# Patient Record
Sex: Male | Born: 1945 | Race: Black or African American | Hispanic: No | Marital: Single | Smoking: Never smoker
Health system: Southern US, Community
[De-identification: ages and names within clinical notes are randomized; demographics above are authoritative.]

## PROBLEM LIST (undated history)

## (undated) HISTORY — PX: TESTICLE REMOVAL: SHX68

---

## 2021-02-17 ENCOUNTER — Inpatient Hospital Stay (HOSPITAL_COMMUNITY)
Admission: EM | Admit: 2021-02-17 | Discharge: 2021-02-23 | DRG: 698 | Disposition: A | Discharge status: Skilled Nursing Facility | Payer: Medicare HMO | Source: Skilled Nursing Facility | Attending: Internal Medicine | Admitting: Internal Medicine

## 2021-02-17 ENCOUNTER — Emergency Department (HOSPITAL_COMMUNITY): Payer: Medicare HMO

## 2021-02-17 ENCOUNTER — Encounter (HOSPITAL_COMMUNITY): Payer: Self-pay | Admitting: Emergency Medicine

## 2021-02-17 DIAGNOSIS — D649 Anemia, unspecified: Secondary | ICD-10-CM

## 2021-02-17 DIAGNOSIS — N401 Enlarged prostate with lower urinary tract symptoms: Secondary | ICD-10-CM | POA: Diagnosis present

## 2021-02-17 DIAGNOSIS — D62 Acute posthemorrhagic anemia: Secondary | ICD-10-CM | POA: Diagnosis present

## 2021-02-17 DIAGNOSIS — Z20822 Contact with and (suspected) exposure to covid-19: Secondary | ICD-10-CM | POA: Diagnosis present

## 2021-02-17 DIAGNOSIS — N492 Inflammatory disorders of scrotum: Secondary | ICD-10-CM | POA: Diagnosis present

## 2021-02-17 DIAGNOSIS — Z7901 Long term (current) use of anticoagulants: Secondary | ICD-10-CM | POA: Diagnosis present

## 2021-02-17 DIAGNOSIS — B952 Enterococcus as the cause of diseases classified elsewhere: Secondary | ICD-10-CM | POA: Diagnosis present

## 2021-02-17 DIAGNOSIS — E669 Obesity, unspecified: Secondary | ICD-10-CM | POA: Diagnosis present

## 2021-02-17 DIAGNOSIS — N390 Urinary tract infection, site not specified: Secondary | ICD-10-CM | POA: Diagnosis not present

## 2021-02-17 DIAGNOSIS — Z1612 Extended spectrum beta lactamase (ESBL) resistance: Secondary | ICD-10-CM | POA: Diagnosis present

## 2021-02-17 DIAGNOSIS — Y846 Urinary catheterization as the cause of abnormal reaction of the patient, or of later complication, without mention of misadventure at the time of the procedure: Secondary | ICD-10-CM | POA: Diagnosis present

## 2021-02-17 DIAGNOSIS — N138 Other obstructive and reflux uropathy: Secondary | ICD-10-CM | POA: Diagnosis present

## 2021-02-17 DIAGNOSIS — Z8619 Personal history of other infectious and parasitic diseases: Secondary | ICD-10-CM

## 2021-02-17 DIAGNOSIS — Z1621 Resistance to vancomycin: Secondary | ICD-10-CM | POA: Diagnosis present

## 2021-02-17 DIAGNOSIS — R310 Gross hematuria: Secondary | ICD-10-CM | POA: Diagnosis present

## 2021-02-17 DIAGNOSIS — I10 Essential (primary) hypertension: Secondary | ICD-10-CM | POA: Diagnosis present

## 2021-02-17 DIAGNOSIS — Z9079 Acquired absence of other genital organ(s): Secondary | ICD-10-CM

## 2021-02-17 DIAGNOSIS — I69351 Hemiplegia and hemiparesis following cerebral infarction affecting right dominant side: Secondary | ICD-10-CM

## 2021-02-17 DIAGNOSIS — R319 Hematuria, unspecified: Secondary | ICD-10-CM

## 2021-02-17 DIAGNOSIS — Z79899 Other long term (current) drug therapy: Secondary | ICD-10-CM

## 2021-02-17 DIAGNOSIS — G9341 Metabolic encephalopathy: Secondary | ICD-10-CM | POA: Diagnosis present

## 2021-02-17 DIAGNOSIS — Z6839 Body mass index (BMI) 39.0-39.9, adult: Secondary | ICD-10-CM | POA: Diagnosis not present

## 2021-02-17 DIAGNOSIS — E119 Type 2 diabetes mellitus without complications: Secondary | ICD-10-CM | POA: Diagnosis present

## 2021-02-17 DIAGNOSIS — B9620 Unspecified Escherichia coli [E. coli] as the cause of diseases classified elsewhere: Secondary | ICD-10-CM | POA: Diagnosis present

## 2021-02-17 DIAGNOSIS — I480 Paroxysmal atrial fibrillation: Secondary | ICD-10-CM | POA: Diagnosis present

## 2021-02-17 DIAGNOSIS — N179 Acute kidney failure, unspecified: Secondary | ICD-10-CM

## 2021-02-17 DIAGNOSIS — N320 Bladder-neck obstruction: Secondary | ICD-10-CM

## 2021-02-17 DIAGNOSIS — Z79891 Long term (current) use of opiate analgesic: Secondary | ICD-10-CM

## 2021-02-17 DIAGNOSIS — R338 Other retention of urine: Secondary | ICD-10-CM | POA: Diagnosis present

## 2021-02-17 DIAGNOSIS — Z978 Presence of other specified devices: Secondary | ICD-10-CM

## 2021-02-17 DIAGNOSIS — T83518A Infection and inflammatory reaction due to other urinary catheter, initial encounter: Principal | ICD-10-CM | POA: Diagnosis present

## 2021-02-17 DIAGNOSIS — Z7984 Long term (current) use of oral hypoglycemic drugs: Secondary | ICD-10-CM

## 2021-02-17 DIAGNOSIS — Z7982 Long term (current) use of aspirin: Secondary | ICD-10-CM

## 2021-02-17 DIAGNOSIS — N136 Pyonephrosis: Secondary | ICD-10-CM | POA: Diagnosis present

## 2021-02-17 DIAGNOSIS — N1330 Unspecified hydronephrosis: Secondary | ICD-10-CM

## 2021-02-17 LAB — URINALYSIS, ROUTINE W REFLEX MICROSCOPIC

## 2021-02-17 LAB — CBC WITH DIFFERENTIAL/PLATELET
Abs Immature Granulocytes: 0.02 10*3/uL (ref 0.00–0.07)
Basophils Absolute: 0 10*3/uL (ref 0.0–0.1)
Basophils Relative: 0 %
Eosinophils Absolute: 0.1 10*3/uL (ref 0.0–0.5)
Eosinophils Relative: 1 %
HCT: 21.6 % — ABNORMAL LOW (ref 39.0–52.0)
Hemoglobin: 6.5 g/dL — CL (ref 13.0–17.0)
Immature Granulocytes: 0 %
Lymphocytes Relative: 32 %
Lymphs Abs: 1.8 10*3/uL (ref 0.7–4.0)
MCH: 26.7 pg (ref 26.0–34.0)
MCHC: 30.1 g/dL (ref 30.0–36.0)
MCV: 88.9 fL (ref 80.0–100.0)
Monocytes Absolute: 0.9 10*3/uL (ref 0.1–1.0)
Monocytes Relative: 16 %
Neutro Abs: 2.8 10*3/uL (ref 1.7–7.7)
Neutrophils Relative %: 51 %
Platelets: 341 10*3/uL (ref 150–400)
RBC: 2.43 MIL/uL — ABNORMAL LOW (ref 4.22–5.81)
RDW: 16.7 % — ABNORMAL HIGH (ref 11.5–15.5)
WBC: 5.6 10*3/uL (ref 4.0–10.5)
nRBC: 0 % (ref 0.0–0.2)

## 2021-02-17 LAB — BASIC METABOLIC PANEL
Anion gap: 10 (ref 5–15)
BUN: 35 mg/dL — ABNORMAL HIGH (ref 8–23)
CO2: 17 mmol/L — ABNORMAL LOW (ref 22–32)
Calcium: 9 mg/dL (ref 8.9–10.3)
Chloride: 108 mmol/L (ref 98–111)
Creatinine, Ser: 2.92 mg/dL — ABNORMAL HIGH (ref 0.61–1.24)
GFR, Estimated: 22 mL/min — ABNORMAL LOW (ref >60)
Glucose, Bld: 96 mg/dL (ref 70–99)
Potassium: 4.4 mmol/L (ref 3.5–5.1)
Sodium: 135 mmol/L (ref 135–145)

## 2021-02-17 LAB — PREPARE RBC (CROSSMATCH)

## 2021-02-17 LAB — URINALYSIS, MICROSCOPIC (REFLEX)
RBC / HPF: >50 RBC/hpf (ref 0–5)
Squamous Epithelial / HPF: NONE SEEN (ref 0–5)
WBC, UA: >50 WBC/hpf (ref 0–5)

## 2021-02-17 LAB — ABO/RH: ABO/RH(D): O POS

## 2021-02-17 MED ORDER — SODIUM CHLORIDE 0.9 % IV SOLN
10.0000 mL/h | Freq: Once | INTRAVENOUS | Status: AC
  Administered 2021-02-17: 10 mL/h via INTRAVENOUS

## 2021-02-17 MED ORDER — SODIUM CHLORIDE 0.9 % IV BOLUS
1000.0000 mL | Freq: Once | INTRAVENOUS | Status: AC
  Administered 2021-02-18: 1000 mL via INTRAVENOUS

## 2021-02-17 MED ORDER — SODIUM CHLORIDE 0.9 % IV SOLN: 1.0000 g | INTRAVENOUS | Status: DC

## 2021-02-17 NOTE — ED Provider Notes (Signed)
[facility] EMERGENCY DEPARTMENT Provider Note   CSN: [id number] Arrival date & time: 02/17/21  1726     History Chief Complaint  Patient presents with   Hematuria    Indwelling foley cath    Matthew Williamson is a 75 y.o. male.  75 year old male history of atrial fibrillation on Eliquis, CVA, history of urinary retention with indwelling Foley catheter; reported to the ER secondary to hematuria noted within indwelling Foley cath.  No pain associated with catheter reported. Denies abdominal pain, GU trauma, nausea or vomiting.  Denies Recent medication changes.  Patient is a poor historian given history of CVA. Per EMS documentation nursing home attempted irrigation of the Foley catheter without significant improvement and hematuria over the past 2-3 days since symptom onset.   The history is provided by the patient. No language interpreter was used.  Hematuria Pertinent negatives include no chest pain, no abdominal pain, no headaches and no shortness of breath.      History reviewed. No pertinent past medical history.  There are no problems to display for this patient.   History reviewed. No pertinent surgical history.     History reviewed. No pertinent family history.     Home Medications Prior to Admission medications   Not on File    Allergies    Patient has no known allergies.  Review of Systems   Review of Systems  Constitutional:  Negative for chills and fever.  HENT:  Negative for facial swelling and trouble swallowing.   Eyes:  Negative for photophobia and visual disturbance.  Respiratory:  Negative for cough and shortness of breath.   Cardiovascular:  Negative for chest pain and palpitations.  Gastrointestinal:  Negative for abdominal pain, nausea and vomiting.  Endocrine: Negative for polydipsia and polyuria.  Genitourinary:  Positive for hematuria. Negative for difficulty urinating.  Musculoskeletal:  Negative for gait problem and  joint swelling.  Skin:  Negative for pallor and rash.  Neurological:  Negative for syncope and headaches.  Psychiatric/Behavioral:  Negative for agitation and confusion.    Physical Exam Updated Vital Signs BP 132/68   Pulse 62   Temp 99.3 F (37.4 C) (Oral)   Resp (!) 21   Ht 5\' 11"  (1.803 m)   Wt 127 kg   SpO2 100%   BMI 39.05 kg/m   Physical Exam Vitals and nursing note reviewed.  Constitutional:      General: He is not in acute distress.    Appearance: He is well-developed.  HENT:     Head: Normocephalic and atraumatic.     Right Ear: External ear normal.     Left Ear: External ear normal.     Mouth/Throat:     Mouth: Mucous membranes are moist.  Eyes:     General: No scleral icterus. Cardiovascular:     Rate and Rhythm: Normal rate. Rhythm irregular.     Pulses: Normal pulses.     Heart sounds: Normal heart sounds.  Pulmonary:     Effort: Pulmonary effort is normal. No respiratory distress.     Breath sounds: Normal breath sounds.  Abdominal:     General: Abdomen is flat.     Palpations: Abdomen is soft.     Tenderness: There is no abdominal tenderness.  Genitourinary:    Comments: 72 French Foley catheter in place, hematuria noted in Foley bag.  No bleeding around urethral meatus.  No penile or scrotal discomfort. Musculoskeletal:        General: Normal range  of motion.     Cervical back: Normal range of motion.     Right lower leg: No edema.     Left lower leg: No edema.  Skin:    General: Skin is warm and dry.     Capillary Refill: Capillary refill takes less than 2 seconds.  Neurological:     Mental Status: He is alert and oriented to person, place, and time.  Psychiatric:        Mood and Affect: Mood normal.        Behavior: Behavior normal.    ED Results / Procedures / Treatments   Labs (all labs ordered are listed, but only abnormal results are displayed) Labs Reviewed  CBC WITH DIFFERENTIAL/PLATELET - Abnormal; Notable for the following  components:      Result Value   RBC 2.43 (*)    Hemoglobin 6.5 (*)    HCT 21.6 (*)    RDW 16.7 (*)    All other components within normal limits  BASIC METABOLIC PANEL - Abnormal; Notable for the following components:   CO2 17 (*)    BUN 35 (*)    Creatinine, Ser 2.92 (*)    GFR, Estimated 22 (*)    All other components within normal limits  URINALYSIS, ROUTINE W REFLEX MICROSCOPIC - Abnormal; Notable for the following components:   Color, Urine RED (*)    APPearance TURBID (*)    Glucose, UA   (*)    Value: TEST NOT REPORTED DUE TO COLOR INTERFERENCE OF URINE PIGMENT   Hgb urine dipstick   (*)    Value: TEST NOT REPORTED DUE TO COLOR INTERFERENCE OF URINE PIGMENT   Bilirubin Urine   (*)    Value: TEST NOT REPORTED DUE TO COLOR INTERFERENCE OF URINE PIGMENT   Ketones, ur   (*)    Value: TEST NOT REPORTED DUE TO COLOR INTERFERENCE OF URINE PIGMENT   Protein, ur   (*)    Value: TEST NOT REPORTED DUE TO COLOR INTERFERENCE OF URINE PIGMENT   Nitrite   (*)    Value: TEST NOT REPORTED DUE TO COLOR INTERFERENCE OF URINE PIGMENT   Leukocytes,Ua   (*)    Value: TEST NOT REPORTED DUE TO COLOR INTERFERENCE OF URINE PIGMENT   All other components within normal limits  URINALYSIS, MICROSCOPIC (REFLEX) - Abnormal; Notable for the following components:   Bacteria, UA MANY (*)    All other components within normal limits  PREPARE RBC (CROSSMATCH)  TYPE AND SCREEN  ABO/RH    EKG None  Radiology CT Renal Stone Study  Result Date: 02/17/2021 CLINICAL DATA:  Hematuria.  Renal failure. EXAM: CT ABDOMEN AND PELVIS WITHOUT CONTRAST TECHNIQUE: Multidetector CT imaging of the abdomen and pelvis was performed following the standard protocol without IV contrast. COMPARISON:  Abdominopelvic CT 01/04/2021 FINDINGS: Lower chest: Mild cardiomegaly. Trace left pleural thickening without significant effusion. No focal airspace disease. Hepatobiliary: Borderline hepatic steatosis. No evidence of focal  hepatic lesion on this unenhanced exam. Lamellated gallstone measuring 3.2 cm. No pericholecystic fat stranding. No biliary dilatation. Pancreas: No ductal dilatation or inflammation. Spleen: Normal in size without focal abnormality. Adrenals/Urinary Tract: No adrenal nodule. There is moderate bilateral hydroureteronephrosis. Both ureters are dilated to the bladder insertion. No renal or ureteral calculi bladder is again noted to be markedly distended extending above the umbilicus with bladder volume = 1700 cm^3. Previous air in the bladder wall is no longer seen. There is no definitive bladder wall thickening. Nodular  mass effect on the bladder base from enlarged prostate gland again seen. 15 mm cyst in the posterior left kidney. Smaller low-density in the upper left kidney is too small to characterize but likely cyst. Tiny low-density in the medial right kidney is also too small to characterize. These lesions are not well assessed on prior exam due to motion. Stomach/Bowel: Partially distended stomach. There is no small bowel obstruction or inflammation. Normal air-filled appendix. Diffuse colonic diverticulosis. No diverticulitis. No colonic inflammation. Vascular/Lymphatic: Moderate aortic and iliac atherosclerosis. No aortic aneurysm. Scattered small retroperitoneal lymph nodes, not enlarged by size criteria. No pelvic adenopathy. Reproductive: Enlarged prostate gland spans 6.5 x 6.4 x 5.8 cm (volume = 130 cm^3) and causes mass effect on the bladder base. Other: Trace perivesicular edema without significant free fluid. No free air. Tiny fat containing umbilical hernia. Minimal fat in the right inguinal canal. Musculoskeletal: Grade 1 anterolisthesis of L4 on L5 likely due to prominent facet degeneration. Multilevel degenerative change throughout the remainder of the lumbar spine. Degenerative change of both hips. Peripherally sclerotic lesion in the left iliac bone appears benign. There are no acute or  suspicious osseous abnormalities. IMPRESSION: 1. Markedly distended urinary bladder. Moderate bilateral hydroureteronephrosis to the bladder insertion. No renal or ureteral calculi. Findings are likely secondary to bladder outlet obstruction given enlarged prostate gland causing mass effect on the bladder base. 2. Colonic diverticulosis without diverticulitis. 3. Cholelithiasis without gallbladder inflammation. Aortic Atherosclerosis (ICD10-I70.0). Electronically Signed   By: Narda RutherfordMelanie  Sanford M.D.   On: 02/17/2021 20:06    Procedures .Critical Care  Date/Time: 02/17/2021 9:01 PM Performed by: Sloan LeiterGray, Kharis Lapenna A, DO Authorized by: Sloan LeiterGray, Alaena Strader A, DO   Critical care provider statement:    Critical care time (minutes):  36   Critical care time was exclusive of:  Separately billable procedures and treating other patients   Critical care was necessary to treat or prevent imminent or life-threatening deterioration of the following conditions:  Renal failure (anemia/bleeding requiring blood transfusion)   Critical care was time spent personally by me on the following activities:  Discussions with consultants, evaluation of patient's response to treatment, examination of patient, ordering and performing treatments and interventions, ordering and review of laboratory studies, ordering and review of radiographic studies, pulse oximetry, re-evaluation of patient's condition, obtaining history from patient or surrogate and review of old charts   Medications Ordered in ED Medications  0.9 %  sodium chloride infusion (has no administration in time range)  sodium chloride 0.9 % bolus 1,000 mL (has no administration in time range)    ED Course  I have reviewed the triage vital signs and the nursing notes.  Pertinent labs & imaging results that were available during my care of the patient were reviewed by me and considered in my medical decision making (see chart for details).    MDM Rules/Calculators/A&P                           This is a 75 year old male with history of urinary retention came to the ER secondary to hematuria.  Is anticoagulated on Eliquis for history of atrial fibrillation.  Soft, nontender, nonperitoneal.  No evidence of overt urethral trauma on exam.  Patient is hemodynamically stable in the ER.  Serious etiology considered  Will attempt irrigation of Foley. Unable to gain clarity of urine following irrigation. Will d/w urology for further evaluation.   Labs reviewed, Hgb is 6.5. Pt also with worsening renal  function from baseline labs that were reviewed from OSH. Concern this could be secondary from bladder obstruction.   Patient consented for [facility].   D/w urology, they will come to evaluate the patient.     Recommend admission for AKI, bladder obstruction, anemia requiring blood transfusion. Pt is agreeable. D/w admission team and they accept patient for admission.     Final Clinical Impression(s) / ED Diagnoses Final diagnoses:  Hematuria, unspecified type  AKI (acute kidney injury) (HCC)  Anemia, unspecified type  Bladder outlet obstruction  Indwelling Foley catheter present  Anticoagulated    Rx / DC Orders ED Discharge Orders     None        Sloan Leiter, DO 02/17/21 2152

## 2021-02-17 NOTE — ED Triage Notes (Signed)
Pt to ED via EMS from [facility]. Pt has been having bloody urine via indwelling cath x 3 days, progressively getting worse, facility irrigating q 6 hours. of bloody urine noted today over a 4 hour period. Orientation at baseline . No medications given by EMS.  Last VS 104/64, hr88, 98%ra, rr20.

## 2021-02-17 NOTE — Consult Note (Addendum)
Urology Consult Note   Requesting Attending Physician:  Marinda Elk, MD Service Providing Consult: Urology  Consulting Attending: Modena Slater, MD   Reason for Consult:  Hematuria Clot retention  HPI: Matthew Williamson is seen in consultation for reasons noted above at the request of Shalhoub, Deno Lunger, MD for evaluation of clot retention.  75 y.o. male with PMH A fib on Eliquis, CVA, BPH with indwelling catheter (patient of Dr. Althea Grimmer) presenting with gross hematuria and clot retention.   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 During that hospitalization, he grew ESBL E Coli. He also required cystoscopy with clot evacuation and fulguration.   At the time cystoscopy revealed the following:  INTRAOPERATIVE FINDINGS/ SYNOPSIS: Urethra with no stricture or lesions. Trilobar enlargement with extremely large median bar with intravesical protrusion. Bilateral ureteral orifices in orthotopic position effluxing clear urine. No bladder stones, no bladder masses, no trabeculae, no diverticula. Venous bleeding at the median bar managed with fulguration of the median bar. No resection was performed.  He improved and was discharged and saw Dr. Myles Lipps in clinic on 02/07/21. 152 gram prostate. Failed voiding trial in clinic. Dr. Althea Grimmer note said patient will need a simple prostatectomy.   Afebrile and HDS on presentation. Labs reveal acute blood loss anemia with hemoglobin of 6.5. No leukocytosis. AKI with Creatinine of 2.92 from baseline 0.82. urine is foul smelling. UA wit hbacteria and WBC's.   CT A/P revealed distended bladder and moderate bilateral hydronephrosis to level of the bladder and prostatomegaly.   Catheter exchanged at bedside for 22Fr three wayy hematuria catheter. 1.8 L immediately drained cabernet colored urine. Then manually irrigated with about 500cc with about 75cc clot  return. CBI started on slow dirp with clear pink UOP.   Past Medical History: History reviewed. No pertinent past medical history.  Past Surgical History:  History reviewed. No pertinent surgical history.  Medication: Current Facility-Administered Medications  Medication Dose Route Frequency Provider Last Rate Last Admin   0.9 %  sodium chloride infusion  10 mL/hr Intravenous Once Tanda Rockers A, DO       cefTRIAXone (ROCEPHIN) 1 g in sodium chloride 0.9 % 100 mL IVPB  1 g Intravenous Q24H Shalhoub, Deno Lunger, MD       sodium chloride 0.9 % bolus 1,000 mL  1,000 mL Intravenous Once Tanda Rockers A, DO       No current outpatient medications on file.    Allergies: No Known Allergies  Social History:    Family History History reviewed. No pertinent family history.  Review of Systems 10 systems were reviewed and are negative except as noted specifically in the HPI.  Objective   Vital signs in last 24 hours: BP 132/68   Pulse 62   Temp 99.3 F (37.4 C) (Oral)   Resp (!) 21   Ht 5\' 11"  (1.803 m)   Wt 127 kg   SpO2 100%   BMI 39.05 kg/m   Physical Exam General: NAD, A&O, resting, appropriate HEENT: [place]/AT, EOMI, MMM Pulmonary: Normal work of breathing Cardiovascular: HDS, adequate peripheral perfusion Abdomen: Soft, NTTP, nondistended. GU: Foley in place draining light pink UOP on slow drip CBI Extremities: warm and well perfused Neuro: Appropriate, no focal neurological deficits  Most Recent Labs: Lab Results  Component Value Date   WBC 5.6 02/17/2021   HGB 6.5 (LL) 02/17/2021   HCT 21.6 (L) 02/17/2021   PLT 341 02/17/2021  Lab Results  Component Value Date   NA 135 02/17/2021   K 4.4 02/17/2021   CL 108 02/17/2021   CO2 17 (L) 02/17/2021   BUN 35 (H) 02/17/2021   CREATININE 2.92 (H) 02/17/2021   CALCIUM 9.0 02/17/2021    No results found for: INR, APTT   Urine Culture: @[id number] (laburin,org,[id number])@   IMAGING: CT Renal Stone  Study  Result Date: 02/17/2021 CLINICAL DATA:  Hematuria.  Renal failure. EXAM: CT ABDOMEN AND PELVIS WITHOUT CONTRAST TECHNIQUE: Multidetector CT imaging of the abdomen and pelvis was performed following the standard protocol without IV contrast. COMPARISON:  Abdominopelvic CT 01/04/2021 FINDINGS: Lower chest: Mild cardiomegaly. Trace left pleural thickening without significant effusion. No focal airspace disease. Hepatobiliary: Borderline hepatic steatosis. No evidence of focal hepatic lesion on this unenhanced exam. Lamellated gallstone measuring 3.2 cm. No pericholecystic fat stranding. No biliary dilatation. Pancreas: No ductal dilatation or inflammation. Spleen: Normal in size without focal abnormality. Adrenals/Urinary Tract: No adrenal nodule. There is moderate bilateral hydroureteronephrosis. Both ureters are dilated to the bladder insertion. No renal or ureteral calculi bladder is again noted to be markedly distended extending above the umbilicus with bladder volume = 1700 cm^3. Previous air in the bladder wall is no longer seen. There is no definitive bladder wall thickening. Nodular mass effect on the bladder base from enlarged prostate gland again seen. 15 mm cyst in the posterior left kidney. Smaller low-density in the upper left kidney is too small to characterize but likely cyst. Tiny low-density in the medial right kidney is also too small to characterize. These lesions are not well assessed on prior exam due to motion. Stomach/Bowel: Partially distended stomach. There is no small bowel obstruction or inflammation. Normal air-filled appendix. Diffuse colonic diverticulosis. No diverticulitis. No colonic inflammation. Vascular/Lymphatic: Moderate aortic and iliac atherosclerosis. No aortic aneurysm. Scattered small retroperitoneal lymph nodes, not enlarged by size criteria. No pelvic adenopathy. Reproductive: Enlarged prostate gland spans 6.5 x 6.4 x 5.8 cm (volume = 130 cm^3) and causes mass  effect on the bladder base. Other: Trace perivesicular edema without significant free fluid. No free air. Tiny fat containing umbilical hernia. Minimal fat in the right inguinal canal. Musculoskeletal: Grade 1 anterolisthesis of L4 on L5 likely due to prominent facet degeneration. Multilevel degenerative change throughout the remainder of the lumbar spine. Degenerative change of both hips. Peripherally sclerotic lesion in the left iliac bone appears benign. There are no acute or suspicious osseous abnormalities. IMPRESSION: 1. Markedly distended urinary bladder. Moderate bilateral hydroureteronephrosis to the bladder insertion. No renal or ureteral calculi. Findings are likely secondary to bladder outlet obstruction given enlarged prostate gland causing mass effect on the bladder base. 2. Colonic diverticulosis without diverticulitis. 3. Cholelithiasis without gallbladder inflammation. Aortic Atherosclerosis (ICD10-I70.0). Electronically Signed   By: 01/06/2021 M.D.   On: 02/17/2021 20:06    ------  Assessment:  75 y.o. male with PMH A fib on Eliquis, CVA, BPH with indwelling catheter (patient of Dr. 66) presenting with gross hematuria and clot retention.   Afebrile and HDS on presentation. Labs reveal acute blood loss anemia with hemoglobin of 6.5. No leukocytosis. AKI with Creatinine of 2.92 from baseline 0.82. urine is foul smelling. UA wit hbacteria and WBC's.   CT A/P revealed distended bladder and moderate bilateral hydronephrosis to level of the bladder and prostatomegaly.   Catheter exchanged at bedside for 22Fr three wayy hematuria catheter. 1.8 L immediately drained cabernet colored urine. Then manually irrigated with about 500cc with about 75cc clot return.  CBI started on slow drip with clear pink UOP.   A specimen was collected from fresh catheter insertion to send for urine culture. Recommend admitting the patient, weaning CBI for light pink output, and resuscitating him as  needed for acute blood loss anemia. Recommend holding eliquis for now.    Recommendations:  - please make patient NPO at midnight in the event he requires cystoscopic clot evacuation/fulguration - Continue CBI, weaning drip to a slow drip as long as urine remains light pink - If catheter obstructs, please stop CBI and manually irrigate catheter with 60cc sterile saline. If catheter remains obstructed pleas enotify urology on call - Agree with resuscitation for acute blood loss anemia - Agree with broad antibiotics (history of ESBL E Coli emphysematous cystitis) tailored per urine culture data - We will continue to follow - Please hold eliquis for now    Thank you for this consult. Please contact the urology consult pager with any further questions/concerns.

## 2021-02-18 ENCOUNTER — Encounter (HOSPITAL_COMMUNITY): Payer: Self-pay | Admitting: Internal Medicine

## 2021-02-18 DIAGNOSIS — I10 Essential (primary) hypertension: Secondary | ICD-10-CM

## 2021-02-18 DIAGNOSIS — N138 Other obstructive and reflux uropathy: Secondary | ICD-10-CM

## 2021-02-18 DIAGNOSIS — I480 Paroxysmal atrial fibrillation: Secondary | ICD-10-CM | POA: Diagnosis present

## 2021-02-18 DIAGNOSIS — D62 Acute posthemorrhagic anemia: Secondary | ICD-10-CM | POA: Diagnosis present

## 2021-02-18 DIAGNOSIS — N390 Urinary tract infection, site not specified: Secondary | ICD-10-CM | POA: Diagnosis present

## 2021-02-18 DIAGNOSIS — E119 Type 2 diabetes mellitus without complications: Secondary | ICD-10-CM

## 2021-02-18 DIAGNOSIS — G9341 Metabolic encephalopathy: Secondary | ICD-10-CM | POA: Diagnosis present

## 2021-02-18 HISTORY — DX: Paroxysmal atrial fibrillation: I48.0

## 2021-02-18 HISTORY — DX: Other obstructive and reflux uropathy: N13.8

## 2021-02-18 HISTORY — DX: Type 2 diabetes mellitus without complications: E11.9

## 2021-02-18 HISTORY — DX: Essential (primary) hypertension: I10

## 2021-02-18 LAB — CBC
HCT: 24.3 % — ABNORMAL LOW (ref 39.0–52.0)
HCT: 24.1 % — ABNORMAL LOW (ref 39.0–52.0)
HCT: 24.2 % — ABNORMAL LOW (ref 39.0–52.0)
Hemoglobin: 7.4 g/dL — ABNORMAL LOW (ref 13.0–17.0)
Hemoglobin: 7.4 g/dL — ABNORMAL LOW (ref 13.0–17.0)
Hemoglobin: 7.6 g/dL — ABNORMAL LOW (ref 13.0–17.0)
MCH: 27 pg (ref 26.0–34.0)
MCH: 27.3 pg (ref 26.0–34.0)
MCH: 27.4 pg (ref 26.0–34.0)
MCHC: 30.5 g/dL (ref 30.0–36.0)
MCHC: 30.7 g/dL (ref 30.0–36.0)
MCHC: 31.4 g/dL (ref 30.0–36.0)
MCV: 88.7 fL (ref 80.0–100.0)
MCV: 88.9 fL (ref 80.0–100.0)
MCV: 87.4 fL (ref 80.0–100.0)
Platelets: 339 10*3/uL (ref 150–400)
Platelets: 350 10*3/uL (ref 150–400)
Platelets: 347 10*3/uL (ref 150–400)
RBC: 2.74 MIL/uL — ABNORMAL LOW (ref 4.22–5.81)
RBC: 2.71 MIL/uL — ABNORMAL LOW (ref 4.22–5.81)
RBC: 2.77 MIL/uL — ABNORMAL LOW (ref 4.22–5.81)
RDW: 15.9 % — ABNORMAL HIGH (ref 11.5–15.5)
RDW: 16.2 % — ABNORMAL HIGH (ref 11.5–15.5)
RDW: 16 % — ABNORMAL HIGH (ref 11.5–15.5)
WBC: 5.5 10*3/uL (ref 4.0–10.5)
WBC: 5.1 10*3/uL (ref 4.0–10.5)
WBC: 6.6 10*3/uL (ref 4.0–10.5)
nRBC: 0 % (ref 0.0–0.2)
nRBC: 0 % (ref 0.0–0.2)
nRBC: 0 % (ref 0.0–0.2)

## 2021-02-18 LAB — RESP PANEL BY RT-PCR (FLU A&B, COVID) ARPGX2
Influenza A by PCR: NEGATIVE
Influenza B by PCR: NEGATIVE
SARS Coronavirus 2 by RT PCR: NEGATIVE

## 2021-02-18 LAB — COMPREHENSIVE METABOLIC PANEL
ALT: 12 U/L (ref 0–44)
AST: 15 U/L (ref 15–41)
Albumin: 2.4 g/dL — ABNORMAL LOW (ref 3.5–5.0)
Alkaline Phosphatase: 50 U/L (ref 38–126)
Anion gap: 7 (ref 5–15)
BUN: 17 mg/dL (ref 8–23)
CO2: 20 mmol/L — ABNORMAL LOW (ref 22–32)
Calcium: 8.8 mg/dL — ABNORMAL LOW (ref 8.9–10.3)
Chloride: 114 mmol/L — ABNORMAL HIGH (ref 98–111)
Creatinine, Ser: 1.14 mg/dL (ref 0.61–1.24)
GFR, Estimated: >60 mL/min (ref >60)
Glucose, Bld: 92 mg/dL (ref 70–99)
Potassium: 4 mmol/L (ref 3.5–5.1)
Sodium: 141 mmol/L (ref 135–145)
Total Bilirubin: 1 mg/dL (ref 0.3–1.2)
Total Protein: 6.4 g/dL — ABNORMAL LOW (ref 6.5–8.1)

## 2021-02-18 LAB — CBG MONITORING, ED
Glucose-Capillary: 74 mg/dL (ref 70–99)
Glucose-Capillary: 78 mg/dL (ref 70–99)
Glucose-Capillary: 87 mg/dL (ref 70–99)
Glucose-Capillary: 87 mg/dL (ref 70–99)
Glucose-Capillary: 94 mg/dL (ref 70–99)

## 2021-02-18 LAB — MAGNESIUM
Magnesium: 1.6 mg/dL — ABNORMAL LOW (ref 1.7–2.4)
Magnesium: 1.3 mg/dL — ABNORMAL LOW (ref 1.7–2.4)

## 2021-02-18 LAB — HEMOGLOBIN A1C
Hgb A1c MFr Bld: 6.1 % — ABNORMAL HIGH (ref 4.8–5.6)
Mean Plasma Glucose: 128.37 mg/dL

## 2021-02-18 LAB — GLUCOSE, CAPILLARY
Glucose-Capillary: 110 mg/dL — ABNORMAL HIGH (ref 70–99)
Glucose-Capillary: 110 mg/dL — ABNORMAL HIGH (ref 70–99)

## 2021-02-18 LAB — LACTIC ACID, PLASMA: Lactic Acid, Venous: 1.6 mmol/L (ref 0.5–1.9)

## 2021-02-18 MED ORDER — ATORVASTATIN CALCIUM 40 MG PO TABS
40.0000 mg | ORAL_TABLET | Freq: Every day | ORAL | Status: DC
  Administered 2021-02-18 – 2021-02-23 (×6): 40 mg via ORAL
  Filled 2021-02-18 (×6): qty 1

## 2021-02-18 MED ORDER — SODIUM CHLORIDE 0.9 % IV SOLN
1.0000 g | Freq: Three times a day (TID) | INTRAVENOUS | Status: DC
  Administered 2021-02-19 – 2021-02-20 (×6): 1 g via INTRAVENOUS
  Filled 2021-02-18 (×8): qty 1

## 2021-02-18 MED ORDER — MAGNESIUM SULFATE 2 GM/50ML IV SOLN
2.0000 g | Freq: Once | INTRAVENOUS | Status: AC
  Administered 2021-02-18: 2 g via INTRAVENOUS
  Filled 2021-02-18: qty 50

## 2021-02-18 MED ORDER — AMLODIPINE BESYLATE 5 MG PO TABS
5.0000 mg | ORAL_TABLET | Freq: Every day | ORAL | Status: DC
  Administered 2021-02-18 – 2021-02-23 (×6): 5 mg via ORAL
  Filled 2021-02-18 (×6): qty 1

## 2021-02-18 MED ORDER — TAMSULOSIN HCL 0.4 MG PO CAPS
0.4000 mg | ORAL_CAPSULE | Freq: Every day | ORAL | Status: DC
  Administered 2021-02-18 – 2021-02-23 (×6): 0.4 mg via ORAL
  Filled 2021-02-18 (×6): qty 1

## 2021-02-18 MED ORDER — SODIUM CHLORIDE 0.9 % IV SOLN
1.0000 g | Freq: Two times a day (BID) | INTRAVENOUS | Status: DC
  Administered 2021-02-18 (×2): 1 g via INTRAVENOUS
  Filled 2021-02-18 (×4): qty 1

## 2021-02-18 MED ORDER — POLYETHYLENE GLYCOL 3350 17 G PO PACK: 17.0000 g | PACK | Freq: Every day | ORAL | Status: DC | PRN

## 2021-02-18 MED ORDER — ACETAMINOPHEN 650 MG RE SUPP: 650.0000 mg | Freq: Four times a day (QID) | RECTAL | Status: DC | PRN

## 2021-02-18 MED ORDER — INSULIN ASPART 100 UNIT/ML IJ SOLN
0.0000 [IU] | Freq: Four times a day (QID) | INTRAMUSCULAR | Status: DC
  Administered 2021-02-22: 2 [IU] via SUBCUTANEOUS
  Administered 2021-02-23: 3 [IU] via SUBCUTANEOUS

## 2021-02-18 MED ORDER — FINASTERIDE 5 MG PO TABS
5.0000 mg | ORAL_TABLET | Freq: Every day | ORAL | Status: DC
  Administered 2021-02-18 – 2021-02-23 (×6): 5 mg via ORAL
  Filled 2021-02-18 (×6): qty 1

## 2021-02-18 MED ORDER — LACTATED RINGERS IV SOLN: INTRAVENOUS | Status: AC

## 2021-02-18 MED ORDER — PANTOPRAZOLE SODIUM 40 MG PO TBEC
40.0000 mg | DELAYED_RELEASE_TABLET | Freq: Every day | ORAL | Status: DC
  Administered 2021-02-18 – 2021-02-23 (×6): 40 mg via ORAL
  Filled 2021-02-18 (×6): qty 1

## 2021-02-18 MED ORDER — LORATADINE 10 MG PO TABS
10.0000 mg | ORAL_TABLET | Freq: Every day | ORAL | Status: DC
  Administered 2021-02-18 – 2021-02-23 (×6): 10 mg via ORAL
  Filled 2021-02-18 (×6): qty 1

## 2021-02-18 MED ORDER — ONDANSETRON HCL 4 MG PO TABS: 4.0000 mg | ORAL_TABLET | Freq: Four times a day (QID) | ORAL | Status: DC | PRN

## 2021-02-18 MED ORDER — ONDANSETRON HCL 4 MG/2ML IJ SOLN: 4.0000 mg | Freq: Four times a day (QID) | INTRAMUSCULAR | Status: DC | PRN

## 2021-02-18 MED ORDER — ACETAMINOPHEN 325 MG PO TABS
650.0000 mg | ORAL_TABLET | Freq: Four times a day (QID) | ORAL | Status: DC | PRN
  Administered 2021-02-20: 650 mg via ORAL
  Filled 2021-02-18: qty 2

## 2021-02-18 NOTE — ED Notes (Signed)
13L of NS irrigated through CBI foley at this time. Fluid in foley bag is cloudy with a yellow tint. No blood clots noted.

## 2021-02-18 NOTE — Progress Notes (Addendum)
PROGRESS NOTE    Matthew Williamson  XNT:[id number] DOB: March 21, 1946 DOA: 02/17/2021 PCP: Pcp, No   Chief Complaint  Patient presents with   Hematuria    Indwelling foley cath   Brief Narrative:  75 yo male with hx CVA, BPH with chronic urinary retention and chronic indwelling foley, atrial fibrillation on eliquis, T2DM, hx scrotal abscess s/p right orchiectomy who presents to [facility] with several days of hematuria.    Of note, patient was hospitalized at an outside hospital in June 2022 due to concerns for pyelonephritis and cystitis secondary to ESBL E. coli with bacteremia.  Presentation was complicated by acute kidney injury.  Due to concerns for Fournier's gangrene patient was eventually transferred to [facility] where patient was evaluated by urology and Fournier's gangrene was ruled out.  CT imaging of the abdomen and pelvis revealed Randol Zumstein hypoattenuating lesion in the bladder and therefore patient underwent cystoscopy which revealed Shanie Mauzy clot that was evacuated.  Patient completed [place] course of meropenem followed by transitioning patient to oral Bactrim which was then eventually transition to oral Augmentin due to development of hyperkalemia..  Renal injury resolved and patient was discharged.  Assessment & Plan:   Principal Problem:   Complicated UTI (urinary tract infection) Active Problems:   Gross hematuria   Acute metabolic encephalopathy   AF (paroxysmal atrial fibrillation) (HCC)   Type 2 diabetes mellitus without complication, without long-term current use of insulin (HCC)   BPH with urinary obstruction   Acute blood loss anemia   Essential hypertension  Complicated UTI  Hx ESBL E. 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 Misplacement of chronic indwelling foley catheter - obstruction of foley related to hematuria, clots? Removed/replaced  Follow renal function - baseline was 0.82 in July, presented with creatinine 2.92 Acei on hold Follow renal function after foley was replaced Will consider repeating renal US in Anberlyn Feimster few days  History of CVA  Acute Metabolic Encephalopathy Per his facility, baseline can ask simple questions, make simple requests They note he has hx of stroke and chronic difficulty with speech as well as chronic weakness (person over phone was unable to tell me which side was weaker, on my exam, right sided) Aspirin on hold Continue lipitor  BPH with Urinary Obstruction Foley in place Follow with his primary urologists outpatient - plan was for prostatectomy in next 2 months Continue finasteride and tamsulosin   Atrial Fibrillation Holding eliquis for now  T2DM SSI, follow A1c 6 in June 2022 Consider discontinuing metformin based on degree of renal recovery  Hypertension Continue amlodipine Hold ace inhibitor  DVT prophylaxis: SCD Code Status: full Family Communication: none at bedside - called number for friend given to me by facility, but they answered then hung up Disposition:   Status is: Inpatient  Remains inpatient appropriate because:Inpatient level of care appropriate due to severity of illness  Dispo: The  patient is from: SNF              Anticipated d/c is to: SNF              Patient currently is not medically stable to d/c.   Difficult to place patient  No       Consultants:  urology  Procedures: none  Antimicrobials: Anti-infectives (From admission, onward)    Start     Dose/Rate Route Frequency Ordered Stop   02/18/21 0045  meropenem (MERREM) 1 g in sodium chloride 0.9 % 100 mL IVPB        1 g 200 mL/hr over 30 Minutes Intravenous Every 12 hours 02/18/21 0044     02/17/21 2200  cefTRIAXone (ROCEPHIN) 1 g in sodium chloride 0.9 % 100 mL IVPB  Status:  Discontinued        1 g 200 mL/hr over 30 Minutes Intravenous Every 24 hours 02/17/21 2156 02/17/21 2332          Subjective: Difficult to understand  Objective: Vitals:   02/18/21 0600 02/18/21 0740 02/18/21 1100 02/18/21 1250  BP: 134/71 130/80 135/79 140/80  Pulse: 61 67 65 70  Resp: 17 18 19 16   Temp:    98.2 F (36.8 C)  TempSrc:    Oral  SpO2: 100% 97% 98% 98%  Weight:      Height:        Intake/Output Summary (Last 24 hours) at 02/18/2021 1457 Last data filed at 02/18/2021 1250 Gross per 24 hour  Intake --  Output 1775 ml  Net -1775 ml   Filed Weights   02/17/21 1736  Weight: 127 kg    Examination:  General exam: Appears calm and comfortable  Respiratory system: Clear to auscultation. Respiratory effort normal. Cardiovascular system: S1 & S2 heard, RRR.  Gastrointestinal system: Abdomen is nondistended, soft and nontender.  Central nervous system: Alert, but some expressive aphasia, difficult to understand - ?right sided weakness Extremities: no LEE    Data Reviewed: I have personally reviewed following labs and imaging studies  CBC: Recent Labs  Lab 02/17/21 1748 02/18/21 0230 02/18/21 0804  WBC 5.6 5.5 5.1  NEUTROABS 2.8  --   --   HGB 6.5* 7.4* 7.4*  HCT 21.6* 24.3* 24.1*  MCV 88.9 88.7 88.9  PLT 341 339 350    Basic Metabolic Panel: Recent Labs  Lab 02/17/21 1748 02/18/21 0230  NA 135  --   K 4.4  --   CL 108  --   CO2 17*  --   GLUCOSE 96  --   BUN 35*  --   CREATININE 2.92*  --   CALCIUM 9.0  --   MG  --  1.6*     GFR: Estimated Creatinine Clearance: 30.1 mL/min (Desirae Mancusi) (by C-G formula based on SCr of 2.92 mg/dL (H)).  Liver Function Tests: No results for input(s): AST, ALT, ALKPHOS, BILITOT, PROT, ALBUMIN in the last 168 hours.  CBG: Recent Labs  Lab 02/18/21 0119 02/18/21 0323 02/18/21 0522 02/18/21 0822 02/18/21 1249  GLUCAP 74 78 87 87 94     Recent Results (from the past 240 hour(s))  Resp Panel by RT-PCR (Flu Florene Brill&B, Covid) Nasopharyngeal Swab     Status: None   Collection Time: 02/17/21 11:29 PM   Specimen: Nasopharyngeal Swab; Nasopharyngeal(NP) swabs in vial transport medium  Result Value Ref Range Status   SARS Coronavirus 2 by RT PCR NEGATIVE NEGATIVE Final    Comment: (NOTE) SARS-CoV-2 target  nucleic acids are NOT DETECTED.  The SARS-CoV-2 RNA is generally detectable in upper respiratory specimens during the acute phase of infection. The lowest concentration of SARS-CoV-2 viral copies this assay can detect is 138 copies/mL. Muneeb Veras negative result does not preclude SARS-Cov-2 infection and should not be used as the sole basis for treatment or other patient management decisions. Sophiagrace Benbrook negative result may occur with  improper specimen collection/handling, submission of specimen other than nasopharyngeal swab, presence of viral mutation(s) within the areas targeted by this assay, and inadequate number of viral copies(<138 copies/mL). Miraj Truss negative result must be combined with clinical observations, patient history, and epidemiological information. The expected result is Negative.  Fact Sheet for Patients:  BloggerCourse.comhttps://www.fda.gov/media/152166/download  Fact Sheet for Healthcare Providers:  SeriousBroker.ithttps://www.fda.gov/media/152162/download  This test is no t yet approved or cleared by the [place] FDA and  has been authorized for detection and/or diagnosis of SARS-CoV-2 by FDA under an Emergency Use Authorization (EUA). This EUA will remain  in effect (meaning this test can be used) for the  duration of the COVID-19 declaration under Section 564(b)(1) of the Act, 21 U.S.C.section 360bbb-3(b)(1), unless the authorization is terminated  or revoked sooner.       Influenza Erleen Egner by PCR NEGATIVE NEGATIVE Final   Influenza B by PCR NEGATIVE NEGATIVE Final    Comment: (NOTE) The Xpert Xpress SARS-CoV-2/FLU/RSV plus assay is intended as an aid in the diagnosis of influenza from Nasopharyngeal swab specimens and should not be used as Baylyn Sickles sole basis for treatment. Nasal washings and aspirates are unacceptable for Xpert Xpress SARS-CoV-2/FLU/RSV testing.  Fact Sheet for Patients: BloggerCourse.comhttps://www.fda.gov/media/152166/download  Fact Sheet for Healthcare Providers: SeriousBroker.ithttps://www.fda.gov/media/152162/download  This test is not yet approved or cleared by the [place] FDA and has been authorized for detection and/or diagnosis of SARS-CoV-2 by FDA under an Emergency Use Authorization (EUA). This EUA will remain in effect (meaning this test can be used) for the duration of the COVID-19 declaration under Section 564(b)(1) of the Act, 21 U.S.C. section 360bbb-3(b)(1), unless the authorization is terminated or revoked.  Performed at [facility], [address] [place], [place] [id number]          Radiology Studies: CT Renal Stone Study  Result Date: 02/17/2021 CLINICAL DATA:  Hematuria.  Renal failure. EXAM: CT ABDOMEN AND PELVIS WITHOUT CONTRAST TECHNIQUE: Multidetector CT imaging of the abdomen and pelvis was performed following the standard protocol without IV contrast. COMPARISON:  Abdominopelvic CT 01/04/2021 FINDINGS: Lower chest: Mild cardiomegaly. Trace left pleural thickening without significant effusion. No focal airspace disease. Hepatobiliary: Borderline hepatic steatosis. No evidence of focal hepatic lesion on this unenhanced exam. Lamellated gallstone measuring 3.2 cm. No pericholecystic fat stranding. No biliary dilatation. Pancreas: No ductal dilatation or  inflammation. Spleen: Normal in size without focal abnormality. Adrenals/Urinary Tract: No adrenal nodule. There is moderate bilateral hydroureteronephrosis. Both ureters are dilated to the bladder insertion. No renal or ureteral calculi bladder is again noted to be markedly distended extending above the umbilicus with bladder volume = 1700 cm^3. Previous air in the bladder wall is no longer seen. There is no definitive bladder wall thickening. Nodular mass effect on the bladder base from enlarged prostate gland again seen. 15 mm cyst in the posterior left kidney. Smaller low-density in the upper left kidney is too small to characterize but likely cyst. Tiny low-density in the medial right kidney is also too small to characterize. These lesions are not well assessed on prior exam due to motion. Stomach/Bowel: Partially distended  stomach. There is no small bowel obstruction or inflammation. Normal air-filled appendix. Diffuse colonic diverticulosis. No diverticulitis. No colonic inflammation. Vascular/Lymphatic: Moderate aortic and iliac atherosclerosis. No aortic aneurysm. Scattered small retroperitoneal lymph nodes, not enlarged by size criteria. No pelvic adenopathy. Reproductive: Enlarged prostate gland spans 6.5 x 6.4 x 5.8 cm (volume = 130 cm^3) and causes mass effect on the bladder base. Other: Trace perivesicular edema without significant free fluid. No free air. Tiny fat containing umbilical hernia. Minimal fat in the right inguinal canal. Musculoskeletal: Grade 1 anterolisthesis of L4 on L5 likely due to prominent facet degeneration. Multilevel degenerative change throughout the remainder of the lumbar spine. Degenerative change of both hips. Peripherally sclerotic lesion in the left iliac bone appears benign. There are no acute or suspicious osseous abnormalities. IMPRESSION: 1. Markedly distended urinary bladder. Moderate bilateral hydroureteronephrosis to the bladder insertion. No renal or ureteral  calculi. Findings are likely secondary to bladder outlet obstruction given enlarged prostate gland causing mass effect on the bladder base. 2. Colonic diverticulosis without diverticulitis. 3. Cholelithiasis without gallbladder inflammation. Aortic Atherosclerosis (ICD10-I70.0). Electronically Signed   By: Narda Rutherford M.D.   On: 02/17/2021 20:06        Scheduled Meds:  amLODipine  5 mg Oral Daily   atorvastatin  40 mg Oral Daily   finasteride  5 mg Oral Daily   insulin aspart  0-15 Units Subcutaneous Q6H   loratadine  10 mg Oral Daily   pantoprazole  40 mg Oral Daily   tamsulosin  0.4 mg Oral QPC breakfast   Continuous Infusions:  lactated ringers 100 mL/hr at 02/18/21 1110   meropenem (MERREM) IV Stopped (02/18/21 1106)     LOS: 1 day    Time spent: over 30 min    Lacretia Nicks, MD [facility]   To contact the attending provider between 7A-7P or the covering provider during after hours 7P-7A, please log into the web site [email or web address] and access using universal [place] password for that web site. If you do not have the password, please call the hospital operator.  02/18/2021, 2:57 PM

## 2021-02-18 NOTE — Progress Notes (Signed)
Pharmacy Antibiotic Note  Matthew Williamson is a 75 y.o. male admitted on 02/17/2021 with  ?UTI, hematuria/clot retention, recent history of ESBL E Coli cystitis .  Pharmacy has been consulted for Merrem dosing. WBC WNL. Scr elevated. Urology following.   Plan: Merrem 1g IV q12h Trend WBC, temp, renal function  F/U urine culture   Height: 5\' 11"  (180.3 cm) Weight: 127 kg (280 lb) IBW/kg (Calculated) : 75.3  Temp (24hrs), Avg:99 F (37.2 C), Min:98.1 F (36.7 C), Max:99.3 F (37.4 C)  Recent Labs  Lab 02/17/21 1748  WBC 5.6  CREATININE 2.92*    Estimated Creatinine Clearance: 30.1 mL/min (A) (by C-G formula based on SCr of 2.92 mg/dL (H)).    No Known Allergies  02/19/21, PharmD, BCPS Clinical Pharmacist Phone: [phone]

## 2021-02-18 NOTE — ED Notes (Signed)
This RN attempted x 2 to obtain labs

## 2021-02-18 NOTE — ED Notes (Signed)
Lunch tray ordered 

## 2021-02-18 NOTE — ED Notes (Signed)
Urologist called back. CBI stopped per verbal order. Pt given food and drink.

## 2021-02-18 NOTE — Progress Notes (Signed)
Urology Progress Note    75 y.o. male with PMH A fib on Eliquis, CVA, BPH with indwelling catheter (patient of Dr. Althea Grimmer) presenting with gross hematuria and clot retention.  History of emphysematous cystitis, urosepsis, gross hematuria [facility].  Subjective: NAEON.  Remains on CBI.  Eliquis being held.  Afebrile hemodynamically stable.  Hemoglobin 7.4 from 6.5 and stable after 1 unit of packed red blood cells for his acute blood loss anemia.  Urine is clear yellow.  Objective: Vital signs in last 24 hours: Temp:  [98.1 F (36.7 C)-99.3 F (37.4 C)] 98.1 F (36.7 C) (08/13 2337) Pulse Rate:  [61-90] 67 (08/14 0740) Resp:  [14-22] 18 (08/14 0740) BP: (109-135)/(61-84) 130/80 (08/14 0740) SpO2:  [97 %-100 %] 97 % (08/14 0740) Weight:  [676 kg] 127 kg (08/13 1736)  Intake/Output from previous day: 08/13 0701 - 08/14 0700 In: -  Out: 275 [Urine:275] Intake/Output this shift: No intake/output data recorded.  Physical Exam:  General: Alert but intermittently confused CV: Regular rate Lungs: No increased work of breathing Abdomen: Soft and nontender. GU: Foley in place draining clear yellow urine Ext: NT, No erythema  Lab Results: Recent Labs    02/17/21 1748 02/18/21 0230 02/18/21 0804  HGB 6.5* 7.4* 7.4*  HCT 21.6* 24.3* 24.1*   Recent Labs    02/17/21 1748  NA 135  K 4.4  CL 108  CO2 17*  GLUCOSE 96  BUN 35*  CREATININE 2.92*  CALCIUM 9.0    Studies/Results: CT Renal Stone Study  Result Date: 02/17/2021 CLINICAL DATA:  Hematuria.  Renal failure. EXAM: CT ABDOMEN AND PELVIS WITHOUT CONTRAST TECHNIQUE: Multidetector CT imaging of the abdomen and pelvis was performed following the standard protocol without IV contrast. COMPARISON:  Abdominopelvic CT 01/04/2021 FINDINGS: Lower chest: Mild cardiomegaly. Trace left pleural thickening without significant effusion. No focal airspace disease. Hepatobiliary: Borderline hepatic  steatosis. No evidence of focal hepatic lesion on this unenhanced exam. Lamellated gallstone measuring 3.2 cm. No pericholecystic fat stranding. No biliary dilatation. Pancreas: No ductal dilatation or inflammation. Spleen: Normal in size without focal abnormality. Adrenals/Urinary Tract: No adrenal nodule. There is moderate bilateral hydroureteronephrosis. Both ureters are dilated to the bladder insertion. No renal or ureteral calculi bladder is again noted to be markedly distended extending above the umbilicus with bladder volume = 1700 cm^3. Previous air in the bladder wall is no longer seen. There is no definitive bladder wall thickening. Nodular mass effect on the bladder base from enlarged prostate gland again seen. 15 mm cyst in the posterior left kidney. Smaller low-density in the upper left kidney is too small to characterize but likely cyst. Tiny low-density in the medial right kidney is also too small to characterize. These lesions are not well assessed on prior exam due to motion. Stomach/Bowel: Partially distended stomach. There is no small bowel obstruction or inflammation. Normal air-filled appendix. Diffuse colonic diverticulosis. No diverticulitis. No colonic inflammation. Vascular/Lymphatic: Moderate aortic and iliac atherosclerosis. No aortic aneurysm. Scattered small retroperitoneal lymph nodes, not enlarged by size criteria. No pelvic adenopathy. Reproductive: Enlarged prostate gland spans 6.5 x 6.4 x 5.8 cm (volume = 130 cm^3) and causes mass effect on the bladder base. Other: Trace perivesicular edema without significant free fluid. No free air. Tiny fat containing umbilical hernia. Minimal fat in the right inguinal canal. Musculoskeletal: Grade 1 anterolisthesis of L4 on L5 likely due to prominent facet degeneration. Multilevel degenerative change throughout the remainder of the lumbar spine. Degenerative  change of both hips. Peripherally sclerotic lesion in the left iliac bone appears  benign. There are no acute or suspicious osseous abnormalities. IMPRESSION: 1. Markedly distended urinary bladder. Moderate bilateral hydroureteronephrosis to the bladder insertion. No renal or ureteral calculi. Findings are likely secondary to bladder outlet obstruction given enlarged prostate gland causing mass effect on the bladder base. 2. Colonic diverticulosis without diverticulitis. 3. Cholelithiasis without gallbladder inflammation. Aortic Atherosclerosis (ICD10-I70.0). Electronically Signed   By: Narda Rutherford M.D.   On: 02/17/2021 20:06    Assessment/Plan:  75 y.o. male with PMH A fib on Eliquis, CVA, BPH with indwelling catheter (patient of Dr. Althea Grimmer) presenting with gross hematuria and clot retention.  History of emphysematous cystitis, urosepsis, gross hematuria requiring [facility] June 2022.  Three-way catheter in place on CBI.  Urine is clear yellow.  His hemoglobin responded appropriately to 1 unit of packed red blood cells and has remained stable at 7.4.  He is on meropenem given his past history of ESBL E. coli.  Eliquis is currently being held.  -Clamp CBI -Okay for diet -With antibiotics tailored per culture data -Third port can be capped and patient can be discharged with his catheter in place from urologic standpoint.  He should follow-up with his local urologist for continued treatment of his severe prostatic hyperplasia.  Dispo: per primary team.    LOS: 1 day

## 2021-02-18 NOTE — H&P (Signed)
History and Physical    Matthew NeighborsCharlie Williamson WUJ:[id number] DOB: 1945/11/26 DOA: 02/17/2021  PCP: Pcp, No  Patient coming from:   [facility]  via EMS Chief Complaint:  Chief Complaint  Patient presents with   Hematuria    Indwelling foley cath     HPI:    75 year old male with Paschal history of hypertension, BPH with chronic urinary tension and indwelling Foley catheter, paroxysmal atrial fibrillation on Eliquis, previous CVA (07/2020) , diabetes mellitus type 2 (last Hgb A1C 6.0% 12/2020, scrotal abscess status post right orchiectomy in 2021 who [facility] department with a several day history of hematuria.  Above, patient was hospitalized at an outside hospital in June 2022 due to concerns for pyelonephritis and cystitis secondary to ESBL E. coli with bacteremia.  Presentation was complicated by acute kidney injury.  Due to concerns for Fournier's gangrene patient was eventually transferred to [facility] where patient was evaluated by urology and Fournier's gangrene was ruled out.  CT imaging of the abdomen and pelvis revealed a hypoattenuating lesion in the bladder and therefore patient underwent cystoscopy which revealed a clot that was evacuated.  Patient completed a course of meropenem followed by transitioning patient to oral Bactrim which was then eventually transition to oral Augmentin due to development of hyperkalemia..  Renal injury resolved and patient was discharged.  Patient is a somewhat poor historian due to confusion with his current illness.  Attempts of been made to contact the daughter via phone conversation but unfortunately inaccurate numbers not listed on the facesheet.  Majority the history has been obtained from discussion with the patient discussion with emergency department staff.  Approximately 3 days ago the patient began to experience gross hematuria.  Patient denies any significant manipulation of the indwelling Foley catheter  that prompted bleeding.  Patient explains that bleeding continued over the past 3 days and was associated with increasing generalized weakness.  Patient denies fevers, abdominal pain, nausea, vomiting, change in appetite, sick contacts.  Despite numerous attempts by patient's skilled nursing facility staff to perform frequent irrigations every 6 hours, hematuria persisted.  Due to persisting hematuria and progressive weakness EMS was contacted and the patient was promptly brought into [facility] emergency department for evaluation.  Upon evaluation in the emergency department patient was found to have a hemoglobin of 6.5.  A 1 unit packed red blood cell transfusion was initiated.  Patient was additionally found to be suffering from acute kidney injury with creatinine of 2.92.  Urology was contacted who promptly came to evaluate the patient at the bedside and placed a 3 way catheter and initiated continuous bladder irrigation.  Instructed that the patient be kept n.p.o. after midnight in case of need for cystoscopic intervention in the morning.  Hospitalist group was then called to assess the patient for admission the hospital.  Review of Systems:   Review of Systems  Constitutional:  Positive for malaise/fatigue.  Genitourinary:  Positive for hematuria.  Neurological:  Positive for weakness.  All other systems reviewed and are negative.  Past Medical History:  Diagnosis Date   AF (paroxysmal atrial fibrillation) (HCC) 02/18/2021   BPH with urinary obstruction 02/18/2021   Essential hypertension 02/18/2021   Type 2 diabetes mellitus without complication, without long-term current use of insulin (HCC) 02/18/2021    History reviewed. No pertinent surgical history.   reports that he has never smoked. He has never used smokeless tobacco. No history on file for alcohol use and drug use.  No Known Allergies  Family History  Family history unknown: Yes     Prior to Admission medications    Medication Sig Start Date End Date Taking? Authorizing Provider  amLODipine (NORVASC) 5 MG tablet Take 5 mg by mouth daily.   Yes [provider]  apixaban (ELIQUIS) 5 MG TABS tablet Take 5 mg by mouth 2 (two) times daily.   Yes [provider]  aspirin EC 81 MG tablet Take 81 mg by mouth daily. Swallow whole.   Yes [provider]  atorvastatin (LIPITOR) 40 MG tablet Take 40 mg by mouth daily.   Yes [provider]  finasteride (PROSCAR) 5 MG tablet Take 5 mg by mouth daily.   Yes [provider]  lisinopril (ZESTRIL) 10 MG tablet Take 10 mg by mouth daily.   Yes [provider]  loratadine (CLARITIN) 10 MG tablet Take 10 mg by mouth daily.   Yes [provider]  metFORMIN (GLUCOPHAGE) 1000 MG tablet Take 1,000 mg by mouth 2 (two) times daily with a meal.   Yes [provider]  pantoprazole (PROTONIX) 40 MG tablet Take 40 mg by mouth daily.   Yes [provider]  sitaGLIPtin (JANUVIA) 100 MG tablet Take 100 mg by mouth daily.   Yes [provider]  tamsulosin (FLOMAX) 0.4 MG CAPS capsule Take 0.4 mg by mouth.   Yes [provider]  traZODone (DESYREL) 50 MG tablet Take 50 mg by mouth at bedtime.   Yes [provider]    Physical Exam: Vitals:   02/17/21 2300 02/17/21 2337 02/18/21 0000 02/18/21 0100  BP: 112/74 122/78 122/73 116/81  Pulse: 76 79 87 73  Resp: 19 16 14 18   Temp:  98.1 F (36.7 C)    TempSrc:      SpO2: 99% 99% 98% 99%  Weight:      Height:        Constitutional: Lethargic but arousable, oriented x1,, no associated distress.   Skin: no rashes, no lesions, poor skin turgor noted. Eyes: Pupils are equally reactive to light.  No evidence of scleral icterus or conjunctival pallor.  ENMT: Dry mucous membranes noted.  Posterior pharynx clear of any exudate or lesions.   Neck: normal, supple, no masses, no thyromegaly.  No evidence of jugular venous distension.    Respiratory: clear to auscultation bilaterally, no wheezing, no crackles. Normal respiratory effort. No accessory muscle use.  Cardiovascular: Regular rate and rhythm, no murmurs / rubs / gallops. No extremity edema. 2+ pedal pulses. No carotid bruits.  Chest:   Nontender without crepitus or deformity.   Back:   Nontender without crepitus or deformity. Abdomen: Mild lower abdominal tenderness noted.  Abdomen is soft however..  No evidence of intra-abdominal masses.  Positive bowel sounds noted in all quadrants.   GU: Foley catheter in place draining rose-colored urine. Musculoskeletal: No joint deformity upper and lower extremities. Good ROM, no contractures. Normal muscle tone.  Neurologic: Patient is lethargic but arousable and oriented x1.  CN 2-12 grossly intact. Sensation intact.  Patient moving all 4 extremities spontaneously.  Patient is following all commands.  Patient is responsive to verbal stimuli.   Psychiatric: Patient exhibits depressed mood with flat affect.  Patient currently does not seem to possess insight as to his current situation.     Labs on Admission: I have personally reviewed following labs and imaging studies -   CBC: Recent Labs  Lab 02/17/21 1748  WBC 5.6  NEUTROABS 2.8  HGB 6.5*  HCT 21.6*  MCV 88.9  PLT 341   Basic Metabolic Panel: Recent Labs  Lab 02/17/21 1748  NA 135  K 4.4  CL 108  CO2 17*  GLUCOSE 96  BUN 35*  CREATININE 2.92*  CALCIUM 9.0   GFR: Estimated Creatinine Clearance: 30.1 mL/min (A) (by C-G formula based on SCr of 2.92 mg/dL (H)). Liver Function Tests: No results for input(s): AST, ALT, ALKPHOS, BILITOT, PROT, ALBUMIN in the last 168 hours. No results for input(s): LIPASE, AMYLASE in the last 168 hours. No results for input(s): AMMONIA in the last 168 hours. Coagulation Profile: No results for input(s): INR, PROTIME in the last 168 hours. Cardiac Enzymes: No results for input(s): CKTOTAL, CKMB, CKMBINDEX, TROPONINI in the  last 168 hours. BNP (last 3 results) No results for input(s): PROBNP in the last 8760 hours. HbA1C: No results for input(s): HGBA1C in the last 72 hours. CBG: Recent Labs  Lab 02/18/21 0119  GLUCAP 74   Lipid Profile: No results for input(s): CHOL, HDL, LDLCALC, TRIG, CHOLHDL, LDLDIRECT in the last 72 hours. Thyroid Function Tests: No results for input(s): TSH, T4TOTAL, FREET4, T3FREE, THYROIDAB in the last 72 hours. Anemia Panel: No results for input(s): VITAMINB12, FOLATE, FERRITIN, TIBC, IRON, RETICCTPCT in the last 72 hours. Urine analysis:    Component Value Date/Time   COLORURINE RED (A) 02/17/2021 1748   APPEARANCEUR TURBID (A) 02/17/2021 1748   LABSPEC  02/17/2021 1748    TEST NOT REPORTED DUE TO COLOR INTERFERENCE OF URINE PIGMENT   PHURINE  02/17/2021 1748    TEST NOT REPORTED DUE TO COLOR INTERFERENCE OF URINE PIGMENT   GLUCOSEU (A) 02/17/2021 1748    TEST NOT REPORTED DUE TO COLOR INTERFERENCE OF URINE PIGMENT   HGBUR (A) 02/17/2021 1748    TEST NOT REPORTED DUE TO COLOR INTERFERENCE OF URINE PIGMENT   BILIRUBINUR (A) 02/17/2021 1748    TEST NOT REPORTED DUE TO COLOR INTERFERENCE OF URINE PIGMENT   KETONESUR (A) 02/17/2021 1748    TEST NOT REPORTED DUE TO COLOR INTERFERENCE OF URINE PIGMENT   PROTEINUR (A) 02/17/2021 1748    TEST NOT REPORTED DUE TO COLOR INTERFERENCE OF URINE PIGMENT   NITRITE (A) 02/17/2021 1748    TEST NOT REPORTED DUE TO COLOR INTERFERENCE OF URINE PIGMENT   LEUKOCYTESUR (A) 02/17/2021 1748    TEST NOT REPORTED DUE TO COLOR INTERFERENCE OF URINE PIGMENT    Radiological Exams on Admission - Personally Reviewed: CT Renal Stone Study  Result Date: 02/17/2021 CLINICAL DATA:  Hematuria.  Renal failure. EXAM: CT ABDOMEN AND PELVIS WITHOUT CONTRAST TECHNIQUE: Multidetector CT imaging of the abdomen and pelvis was performed following the standard protocol without IV contrast. COMPARISON:  Abdominopelvic CT 01/04/2021 FINDINGS: Lower chest: Mild  cardiomegaly. Trace left pleural thickening without significant effusion. No focal airspace disease. Hepatobiliary: Borderline hepatic steatosis. No evidence of focal hepatic lesion on this unenhanced exam. Lamellated gallstone measuring 3.2 cm. No pericholecystic fat stranding. No biliary dilatation. Pancreas: No ductal dilatation or inflammation. Spleen: Normal in size without focal abnormality. Adrenals/Urinary Tract: No adrenal nodule. There is moderate bilateral hydroureteronephrosis. Both ureters are dilated to the bladder insertion. No renal or ureteral calculi bladder is again noted to be markedly distended extending above the umbilicus with bladder volume = 1700 cm^3. Previous air in the bladder wall is no longer seen. There is no definitive bladder wall thickening. Nodular mass effect on the bladder base from enlarged prostate gland again seen. 15 mm cyst in  the posterior left kidney. Smaller low-density in the upper left kidney is too small to characterize but likely cyst. Tiny low-density in the medial right kidney is also too small to characterize. These lesions are not well assessed on prior exam due to motion. Stomach/Bowel: Partially distended stomach. There is no small bowel obstruction or inflammation. Normal air-filled appendix. Diffuse colonic diverticulosis. No diverticulitis. No colonic inflammation. Vascular/Lymphatic: Moderate aortic and iliac atherosclerosis. No aortic aneurysm. Scattered small retroperitoneal lymph nodes, not enlarged by size criteria. No pelvic adenopathy. Reproductive: Enlarged prostate gland spans 6.5 x 6.4 x 5.8 cm (volume = 130 cm^3) and causes mass effect on the bladder base. Other: Trace perivesicular edema without significant free fluid. No free air. Tiny fat containing umbilical hernia. Minimal fat in the right inguinal canal. Musculoskeletal: Grade 1 anterolisthesis of L4 on L5 likely due to prominent facet degeneration. Multilevel degenerative change throughout  the remainder of the lumbar spine. Degenerative change of both hips. Peripherally sclerotic lesion in the left iliac bone appears benign. There are no acute or suspicious osseous abnormalities. IMPRESSION: 1. Markedly distended urinary bladder. Moderate bilateral hydroureteronephrosis to the bladder insertion. No renal or ureteral calculi. Findings are likely secondary to bladder outlet obstruction given enlarged prostate gland causing mass effect on the bladder base. 2. Colonic diverticulosis without diverticulitis. 3. Cholelithiasis without gallbladder inflammation. Aortic Atherosclerosis (ICD10-I70.0). Electronically Signed   By: Narda Rutherford M.D.   On: 02/17/2021 20:06    Telemetry: Personally reviewed.  Rhythm is normal sinus rhythm heart rate of 80 bpm.  Assessment/Plan Principal Problem:   Complicated UTI (urinary tract infection) possibly secondary to ESBL E. Coli  With a several day history of hematuria.  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  Active Problems: Acute blood loss anemia secondary to Gross hematuria  Patient suffering from hematuria for the past 3 days with concerned that this may be secondary to a recurrent infectious process. Patient initially presented with significant gross bleeding with associated clots Patient was promptly evaluated by urology and had a three-way catheter placed with initiation of continuous bladder irrigation Urology's assistance is appreciated Temporarily  holding Eliquis and aspirin therapy Treating patient for possible concurrent complicated urinary tract infection with intravenous meropenem Patient receiving 1 unit packed red blood cell transfusion due to the associated acute blood loss anemia.    Acute metabolic encephalopathy  Patient visibly lethargic and confused This thought to be secondary to underlying complicated urinary tract infection  currently treating underlying infection, anticipate patient's mentation will improve If confusion fails to improve will expand work-up.    BPH with urinary obstruction  Patient possesses an indwelling Foley catheter at baseline due to markedly enlarged prostate Review of outpatient urology notes reveals the patient is to undergo prostatectomy in the next 2 months which has been delayed due to a recent stroke earlier this year. Continue finasteride and tamsulosin    AF (paroxysmal atrial fibrillation) (HCC)  Monitoring patient on telemetry Holding Eliquis due to active hematuria and blood loss anemia Currently rate controlled    Type 2 diabetes mellitus without complication, without long-term current  use of insulin (HCC)  Accu-Cheks every 6 hours while n.p.o. with slight scale insulin. Hemoglobin A1c noted to be 6.0% in June 2022    Essential hypertension  Continue home regimen of the hypertensive therapy as blood pressure tolerates   Code Status:  Full code Family Communication: Unfortunately there is no working number for family listed on the patient's facesheet.  Status is: Inpatient  Remains inpatient appropriate because:Ongoing diagnostic testing needed not appropriate for outpatient work up, IV treatments appropriate due to intensity of illness or inability to take PO, and Inpatient level of care appropriate due to severity of illness  Dispo: The patient is from: SNF              Anticipated d/c is to: SNF              Patient currently is not medically stable to d/c.    Difficult to place patient No        Marinda Elk MD [facility] Pager [phone]  If 7PM-7AM, please contact night-coverage [email or web address] Use universal [place] password for that web site. If you do not have the password, please call the hospital operator.  02/18/2021, 1:29 AM

## 2021-02-18 NOTE — ED Notes (Addendum)
Both times that urinary bas was empty it was clear; no blood and no clots. Provider notified. Irrigation rated slowed.

## 2021-02-18 NOTE — Progress Notes (Signed)
PHARMACY NOTE:  ANTIMICROBIAL RENAL DOSAGE ADJUSTMENT  Current antimicrobial regimen includes a mismatch between antimicrobial dosage and estimated renal function.  As per policy approved by the Pharmacy & Therapeutics and Medical Executive Committees, the antimicrobial dosage will be adjusted accordingly.  Current antimicrobial dosage:  Meropenem 1 gm IV Q 12 hrs  Indication:  Complicated UTI, Hx ESBL E coli UTI  Renal Function:  Estimated Creatinine Clearance: 77.2 mL/min (by C-G formula based on SCr of 1.14 mg/dL). []      On intermittent HD, scheduled: []      On CRRT    Antimicrobial dosage has been changed to:  Meropenem 1 gm IV Q 8 hrs  Thank you for allowing pharmacy to be a part of this patient's care.  , PharmD, BCPS, [facility] Clinical Pharmacist 02/18/2021 5:16 PM

## 2021-02-19 LAB — COMPREHENSIVE METABOLIC PANEL
ALT: 13 U/L (ref 0–44)
AST: 20 U/L (ref 15–41)
Albumin: 2.2 g/dL — ABNORMAL LOW (ref 3.5–5.0)
Alkaline Phosphatase: 51 U/L (ref 38–126)
Anion gap: 5 (ref 5–15)
BUN: 13 mg/dL (ref 8–23)
CO2: 23 mmol/L (ref 22–32)
Calcium: 8.4 mg/dL — ABNORMAL LOW (ref 8.9–10.3)
Chloride: 113 mmol/L — ABNORMAL HIGH (ref 98–111)
Creatinine, Ser: 0.95 mg/dL (ref 0.61–1.24)
GFR, Estimated: >60 mL/min (ref >60)
Glucose, Bld: 108 mg/dL — ABNORMAL HIGH (ref 70–99)
Potassium: 3.8 mmol/L (ref 3.5–5.1)
Sodium: 141 mmol/L (ref 135–145)
Total Bilirubin: 0.8 mg/dL (ref 0.3–1.2)
Total Protein: 5.8 g/dL — ABNORMAL LOW (ref 6.5–8.1)

## 2021-02-19 LAB — CBC WITH DIFFERENTIAL/PLATELET
Abs Immature Granulocytes: 0.04 10*3/uL (ref 0.00–0.07)
Basophils Absolute: 0 10*3/uL (ref 0.0–0.1)
Basophils Relative: 0 %
Eosinophils Absolute: 0.2 10*3/uL (ref 0.0–0.5)
Eosinophils Relative: 2 %
HCT: 21.8 % — ABNORMAL LOW (ref 39.0–52.0)
Hemoglobin: 6.8 g/dL — CL (ref 13.0–17.0)
Immature Granulocytes: 1 %
Lymphocytes Relative: 36 %
Lymphs Abs: 2.9 10*3/uL (ref 0.7–4.0)
MCH: 27.5 pg (ref 26.0–34.0)
MCHC: 31.2 g/dL (ref 30.0–36.0)
MCV: 88.3 fL (ref 80.0–100.0)
Monocytes Absolute: 0.7 10*3/uL (ref 0.1–1.0)
Monocytes Relative: 9 %
Neutro Abs: 4.2 10*3/uL (ref 1.7–7.7)
Neutrophils Relative %: 52 %
Platelets: 333 10*3/uL (ref 150–400)
RBC: 2.47 MIL/uL — ABNORMAL LOW (ref 4.22–5.81)
RDW: 16 % — ABNORMAL HIGH (ref 11.5–15.5)
WBC: 8.1 10*3/uL (ref 4.0–10.5)
nRBC: 0 % (ref 0.0–0.2)

## 2021-02-19 LAB — PHOSPHORUS: Phosphorus: 2.8 mg/dL (ref 2.5–4.6)

## 2021-02-19 LAB — GLUCOSE, CAPILLARY
Glucose-Capillary: 97 mg/dL (ref 70–99)
Glucose-Capillary: 111 mg/dL — ABNORMAL HIGH (ref 70–99)
Glucose-Capillary: 107 mg/dL — ABNORMAL HIGH (ref 70–99)

## 2021-02-19 LAB — HEMOGLOBIN AND HEMATOCRIT, BLOOD
HCT: 25.3 % — ABNORMAL LOW (ref 39.0–52.0)
Hemoglobin: 7.9 g/dL — ABNORMAL LOW (ref 13.0–17.0)

## 2021-02-19 LAB — MAGNESIUM: Magnesium: 1.6 mg/dL — ABNORMAL LOW (ref 1.7–2.4)

## 2021-02-19 LAB — PREPARE RBC (CROSSMATCH)

## 2021-02-19 MED ORDER — SODIUM CHLORIDE 0.9% IV SOLUTION
Freq: Once | INTRAVENOUS | Status: AC
  Administered 2021-02-19: 10 mL/h via INTRAVENOUS

## 2021-02-19 MED ORDER — MAGNESIUM SULFATE 2 GM/50ML IV SOLN
2.0000 g | Freq: Once | INTRAVENOUS | Status: AC
  Administered 2021-02-19: 2 g via INTRAVENOUS
  Filled 2021-02-19: qty 50

## 2021-02-19 NOTE — Progress Notes (Signed)
PROGRESS NOTE    Matthew Williamson  UJW:[id number] DOB: 10/08/1945 DOA: 02/17/2021 PCP: Pcp, No   Chief Complaint  Patient presents with   Hematuria    Indwelling foley cath   Brief Narrative:  75 yo male with hx CVA, BPH with chronic urinary retention and chronic indwelling foley, atrial fibrillation on eliquis, T2DM, hx scrotal abscess s/p right orchiectomy who presents to [facility] with several days of hematuria.    Of note, patient was hospitalized at an outside hospital in June 2022 due to concerns for pyelonephritis and cystitis secondary to ESBL E. coli with bacteremia.  Presentation was complicated by acute kidney injury.  Due to concerns for Fournier's gangrene patient was eventually transferred to [facility] where patient was evaluated by urology and Fournier's gangrene was ruled out.  CT imaging of the abdomen and pelvis revealed Talma Aguillard hypoattenuating lesion in the bladder and therefore patient underwent cystoscopy which revealed Aleera Gilcrease clot that was evacuated.  Patient completed Francia Verry course of meropenem followed by transitioning patient to oral Bactrim which was then eventually transition to oral Augmentin due to development of hyperkalemia..  Renal injury resolved and patient was discharged.  Assessment & Plan:   Principal Problem:   Complicated UTI (urinary tract infection) Active Problems:   Gross hematuria   Acute metabolic encephalopathy   AF (paroxysmal atrial fibrillation) (HCC)   Type 2 diabetes mellitus without complication, without long-term current use of insulin (HCC)   BPH with urinary obstruction   Acute blood loss anemia   Essential hypertension  Complicated UTI  Hx ESBL E. 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 Coli bacteremia in June  Acute Blood Loss Anemia 2/2 Gross Hematuria Baseline hb in 01/2021 appears to have  been around 8 Hb 6.8, transfuse 1 unit pRBC again Transfused 2 unit pRBC Urology consulted -> s/p CBI, now clamped - urine now clear yellow - ok for diet - per urology, ok for discharge from urologic standpoint (needs to follow with local urologist treatment of severe prostatic hyperplasia) Currently eliquis and aspirin are on hold  Acute Kidney Injury  Bilateral Hydroureteronephrosis  Distended Urinary bladder ? Misplacement of chronic indwelling foley catheter - obstruction of foley related to hematuria, clots? Removed/replaced  Follow renal function - creatinine improved Acei on hold Follow renal function after foley was replaced Will consider repeating renal US in [place] few days (will discuss with urology prior to discharge)  History of CVA  Acute Metabolic Encephalopathy Seems to be close to baseline per family  They note he has hx of stroke and chronic difficulty with speech as well as chronic weakness (right sided) Aspirin on hold Continue lipitor  BPH with Urinary Obstruction Foley in place Follow with his primary urologists outpatient - plan was for prostatectomy in next 2 months Continue finasteride and tamsulosin   Atrial Fibrillation Holding eliquis for now  T2DM SSI, follow A1c 6 in June 2022 Consider discontinuing metformin based on degree of renal recovery  Hypertension Continue amlodipine Hold ace inhibitor  DVT prophylaxis: SCD Code Status: full Family Communication: daughter, wife at bedside Disposition:   Status is: Inpatient  Remains inpatient appropriate because:Inpatient level of care appropriate due to severity of illness  Dispo: The patient is from: SNF              Anticipated d/c is to: SNF  Patient currently is not medically stable to d/c.   Difficult to place patient No       Consultants:  urology  Procedures: none  Antimicrobials: Anti-infectives (From admission, onward)    Start     Dose/Rate Route Frequency  Ordered Stop   02/18/21 1830  meropenem (MERREM) 1 g in sodium chloride 0.9 % 100 mL IVPB        1 g 200 mL/hr over 30 Minutes Intravenous Every 8 hours 02/18/21 1715     02/18/21 0045  meropenem (MERREM) 1 g in sodium chloride 0.9 % 100 mL IVPB  Status:  Discontinued        1 g 200 mL/hr over 30 Minutes Intravenous Every 12 hours 02/18/21 0044 02/18/21 1715   02/17/21 2200  cefTRIAXone (ROCEPHIN) 1 g in sodium chloride 0.9 % 100 mL IVPB  Status:  Discontinued        1 g 200 mL/hr over 30 Minutes Intravenous Every 24 hours 02/17/21 2156 02/17/21 2332          Subjective: No complaints, asking when d/c is - difficult to understand overall   Objective: Vitals:   02/19/21 1358 02/19/21 1429 02/19/21 1630 02/19/21 1722  BP: 119/72 117/75 126/68 123/70  Pulse: 87 90 62 63  Resp: 18 18 18 18   Temp: 98.2 F (36.8 C) 98.2 F (36.8 C) 98.4 F (36.9 C)   TempSrc: Oral Oral Oral   SpO2: 97% 100% 100% 100%  Weight:      Height:        Intake/Output Summary (Last 24 hours) at 02/19/2021 1920 Last data filed at 02/19/2021 1358 Gross per 24 hour  Intake 130 ml  Output 1400 ml  Net -1270 ml   Filed Weights   02/17/21 1736  Weight: 127 kg    Examination:  General: No acute distress. Cardiovascular: RRR Lungs: unlabored Abdomen: Soft, nontender, nondistended  Neurological: aphasia, able to understand Moriyah Byington little more today, but still difficult to understand - r sided weakness Skin: Warm and dry. No rashes or lesions. Extremities: No clubbing or cyanosis. No edema.     Data Reviewed: I have personally reviewed following labs and imaging studies  CBC: Recent Labs  Lab 02/17/21 1748 02/18/21 0230 02/18/21 0804 02/18/21 1547 02/19/21 0408  WBC 5.6 5.5 5.1 6.6 8.1  NEUTROABS 2.8  --   --   --  4.2  HGB 6.5* 7.4* 7.4* 7.6* 6.8*  HCT 21.6* 24.3* 24.1* 24.2* 21.8*  MCV 88.9 88.7 88.9 87.4 88.3  PLT 341 339 350 347 333    Basic Metabolic Panel: Recent Labs  Lab  02/17/21 1748 02/18/21 0230 02/18/21 1547 02/19/21 0408  NA 135  --  141 141  K 4.4  --  4.0 3.8  CL 108  --  114* 113*  CO2 17*  --  20* 23  GLUCOSE 96  --  92 108*  BUN 35*  --  17 13  CREATININE 2.92*  --  1.14 0.95  CALCIUM 9.0  --  8.8* 8.4*  MG  --  1.6* 1.3* 1.6*  PHOS  --   --   --  2.8    GFR: Estimated Creatinine Clearance: 92.6 mL/min (by C-G formula based on SCr of 0.95 mg/dL).  Liver Function Tests: Recent Labs  Lab 02/18/21 1547 02/19/21 0408  AST 15 20  ALT 12 13  ALKPHOS 50 51  BILITOT 1.0 0.8  PROT 6.4* 5.8*  ALBUMIN 2.4* 2.2*    CBG:  Recent Labs  Lab 02/18/21 1759 02/18/21 2337 02/19/21 0433 02/19/21 1243 02/19/21 1720  GLUCAP 110* 110* 97 111* 107*     Recent Results (from the past 240 hour(s))  Urine Culture     Status: Abnormal (Preliminary result)   Collection Time: 02/17/21 12:36 AM   Specimen: Urine, Clean Catch  Result Value Ref Range Status   Specimen Description URINE, CLEAN CATCH  Final   Special Requests NONE  Final   Culture (Fawna Cranmer)  Final    >=100,000 COLONIES/mL ESCHERICHIA COLI >=100,000 COLONIES/mL ENTEROCOCCUS FAECIUM CULTURE REINCUBATED FOR BETTER GROWTH Performed at [facility], [address] [place], [place] [address]    Report Status PENDING  Incomplete  Resp Panel by RT-PCR (Flu Cheyla Duchemin&B, Covid) Nasopharyngeal Swab     Status: None   Collection Time: 02/17/21 11:29 PM   Specimen: Nasopharyngeal Swab; Nasopharyngeal(NP) swabs in vial transport medium  Result Value Ref Range Status   SARS Coronavirus 2 by RT PCR NEGATIVE NEGATIVE Final    Comment: (NOTE) SARS-CoV-2 target nucleic acids are NOT DETECTED.  The SARS-CoV-2 RNA is generally detectable in upper respiratory specimens during the acute phase of infection. The lowest concentration of SARS-CoV-2 viral copies this assay can detect is 138 copies/mL. Lashay Osborne negative result does not preclude SARS-Cov-2 infection and should not be used as the sole basis for  treatment or other patient management decisions. Fynn Vanblarcom negative result may occur with  improper specimen collection/handling, submission of specimen other than nasopharyngeal swab, presence of viral mutation(s) within the areas targeted by this assay, and inadequate number of viral copies(<138 copies/mL). Twylla Arceneaux negative result must be combined with clinical observations, patient history, and epidemiological information. The expected result is Negative.  Fact Sheet for Patients:  BloggerCourse.com  Fact Sheet for Healthcare Providers:  SeriousBroker.it  This test is no t yet approved or cleared by the [place] FDA and  has been authorized for detection and/or diagnosis of SARS-CoV-2 by FDA under an Emergency Use Authorization (EUA). This EUA will remain  in effect (meaning this test can be used) for the duration of the COVID-19 declaration under Section 564(b)(1) of the Act, 21 U.S.C.section 360bbb-3(b)(1), unless the authorization is terminated  or revoked sooner.       Influenza Inice Sanluis by PCR NEGATIVE NEGATIVE Final   Influenza B by PCR NEGATIVE NEGATIVE Final    Comment: (NOTE) The Xpert Xpress SARS-CoV-2/FLU/RSV plus assay is intended as an aid in the diagnosis of influenza from Nasopharyngeal swab specimens and should not be used as Torii Royse sole basis for treatment. Nasal washings and aspirates are unacceptable for Xpert Xpress SARS-CoV-2/FLU/RSV testing.  Fact Sheet for Patients: BloggerCourse.com  Fact Sheet for Healthcare Providers: SeriousBroker.it  This test is not yet approved or cleared by the [place] FDA and has been authorized for detection and/or diagnosis of SARS-CoV-2 by FDA under an Emergency Use Authorization (EUA). This EUA will remain in effect (meaning this test can be used) for the duration of the COVID-19 declaration under Section 564(b)(1) of the Act, 21  U.S.C. section 360bbb-3(b)(1), unless the authorization is terminated or revoked.  Performed at [facility], [address], [place], [place] [address]   Culture, blood (routine x 2)     Status: None (Preliminary result)   Collection Time: 02/18/21  2:30 AM   Specimen: BLOOD LEFT ARM  Result Value Ref Range Status   Specimen Description BLOOD LEFT ARM  Final   Special Requests   Final  BOTTLES DRAWN AEROBIC AND ANAEROBIC Blood Culture results may not be optimal due to an excessive volume of blood received in culture bottles   Culture   Final    NO GROWTH 1 DAY Performed at [facility], [address] [place], [place] [address]    Report Status PENDING  Incomplete  Culture, blood (routine x 2)     Status: None (Preliminary result)   Collection Time: 02/18/21  2:44 AM   Specimen: BLOOD LEFT HAND  Result Value Ref Range Status   Specimen Description BLOOD LEFT HAND  Final   Special Requests   Final    BOTTLES DRAWN AEROBIC ONLY Blood Culture results may not be optimal due to an excessive volume of blood received in culture bottles   Culture   Final    NO GROWTH 1 DAY Performed at [facility], [address] [place], [place] [address]    Report Status PENDING  Incomplete         Radiology Studies: CT Renal Stone Study  Result Date: 02/17/2021 CLINICAL DATA:  Hematuria.  Renal failure. EXAM: CT ABDOMEN AND PELVIS WITHOUT CONTRAST TECHNIQUE: Multidetector CT imaging of the abdomen and pelvis was performed following the standard protocol without IV contrast. COMPARISON:  Abdominopelvic CT 01/04/2021 FINDINGS: Lower chest: Mild cardiomegaly. Trace left pleural thickening without significant effusion. No focal airspace disease. Hepatobiliary: Borderline hepatic steatosis. No evidence of focal hepatic lesion on this unenhanced exam. Lamellated gallstone measuring 3.2 cm. No pericholecystic fat stranding. No biliary dilatation. Pancreas: No ductal dilatation or  inflammation. Spleen: Normal in size without focal abnormality. Adrenals/Urinary Tract: No adrenal nodule. There is moderate bilateral hydroureteronephrosis. Both ureters are dilated to the bladder insertion. No renal or ureteral calculi bladder is again noted to be markedly distended extending above the umbilicus with bladder volume = 1700 cm^3. Previous air in the bladder wall is no longer seen. There is no definitive bladder wall thickening. Nodular mass effect on the bladder base from enlarged prostate gland again seen. 15 mm cyst in the posterior left kidney. Smaller low-density in the upper left kidney is too small to characterize but likely cyst. Tiny low-density in the medial right kidney is also too small to characterize. These lesions are not well assessed on prior exam due to motion. Stomach/Bowel: Partially distended stomach. There is no small bowel obstruction or inflammation. Normal air-filled appendix. Diffuse colonic diverticulosis. No diverticulitis. No colonic inflammation. Vascular/Lymphatic: Moderate aortic and iliac atherosclerosis. No aortic aneurysm. Scattered small retroperitoneal lymph nodes, not enlarged by size criteria. No pelvic adenopathy. Reproductive: Enlarged prostate gland spans 6.5 x 6.4 x 5.8 cm (volume = 130 cm^3) and causes mass effect on the bladder base. Other: Trace perivesicular edema without significant free fluid. No free air. Tiny fat containing umbilical hernia. Minimal fat in the right inguinal canal. Musculoskeletal: Grade 1 anterolisthesis of L4 on L5 likely due to prominent facet degeneration. Multilevel degenerative change throughout the remainder of the lumbar spine. Degenerative change of both hips. Peripherally sclerotic lesion in the left iliac bone appears benign. There are no acute or suspicious osseous abnormalities. IMPRESSION: 1. Markedly distended urinary bladder. Moderate bilateral hydroureteronephrosis to the bladder insertion. No renal or ureteral  calculi. Findings are likely secondary to bladder outlet obstruction given enlarged prostate gland causing mass effect on the bladder base. 2. Colonic diverticulosis without diverticulitis. 3. Cholelithiasis without gallbladder inflammation. Aortic Atherosclerosis (ICD10-I70.0). Electronically Signed   By: Narda RutherfordMelanie  Sanford M.D.   On: 02/17/2021 20:06  Scheduled Meds:  amLODipine  5 mg Oral Daily   atorvastatin  40 mg Oral Daily   finasteride  5 mg Oral Daily   insulin aspart  0-15 Units Subcutaneous Q6H   loratadine  10 mg Oral Daily   pantoprazole  40 mg Oral Daily   tamsulosin  0.4 mg Oral QPC breakfast   Continuous Infusions:  meropenem (MERREM) IV 1 g (02/19/21 1730)     LOS: 2 days    Time spent: over 30 min    Lacretia Nicks, MD [facility]   To contact the attending provider between 7A-7P or the covering provider during after hours 7P-7A, please log into the web site [email or web address] and access using universal [place] password for that web site. If you do not have the password, please call the hospital operator.  02/19/2021, 7:20 PM

## 2021-02-19 NOTE — Progress Notes (Signed)
   02/19/21 4008  Provider Notification  Provider Name/Title Bruna Potter, NP  Date Provider Notified 02/19/21  Time Provider Notified [phone]  Notification Type Page  Notification Reason Critical result  Test performed and critical result hgb 6.8  Date Critical Result Received 02/19/21  Time Critical Result Received 0538  Provider response Other (Comment) (waiting for response)

## 2021-02-20 ENCOUNTER — Inpatient Hospital Stay (HOSPITAL_COMMUNITY): Payer: Medicare HMO

## 2021-02-20 LAB — COMPREHENSIVE METABOLIC PANEL
ALT: 17 U/L (ref 0–44)
AST: 19 U/L (ref 15–41)
Albumin: 2.4 g/dL — ABNORMAL LOW (ref 3.5–5.0)
Alkaline Phosphatase: 58 U/L (ref 38–126)
Anion gap: 6 (ref 5–15)
BUN: 7 mg/dL — ABNORMAL LOW (ref 8–23)
CO2: 26 mmol/L (ref 22–32)
Calcium: 8.5 mg/dL — ABNORMAL LOW (ref 8.9–10.3)
Chloride: 109 mmol/L (ref 98–111)
Creatinine, Ser: 0.79 mg/dL (ref 0.61–1.24)
GFR, Estimated: >60 mL/min (ref >60)
Glucose, Bld: 109 mg/dL — ABNORMAL HIGH (ref 70–99)
Potassium: 3.5 mmol/L (ref 3.5–5.1)
Sodium: 141 mmol/L (ref 135–145)
Total Bilirubin: 1 mg/dL (ref 0.3–1.2)
Total Protein: 6.4 g/dL — ABNORMAL LOW (ref 6.5–8.1)

## 2021-02-20 LAB — CBC WITH DIFFERENTIAL/PLATELET
Abs Immature Granulocytes: 0.07 10*3/uL (ref 0.00–0.07)
Basophils Absolute: 0 10*3/uL (ref 0.0–0.1)
Basophils Relative: 0 %
Eosinophils Absolute: 0.2 10*3/uL (ref 0.0–0.5)
Eosinophils Relative: 3 %
HCT: 26.5 % — ABNORMAL LOW (ref 39.0–52.0)
Hemoglobin: 8.2 g/dL — ABNORMAL LOW (ref 13.0–17.0)
Immature Granulocytes: 1 %
Lymphocytes Relative: 33 %
Lymphs Abs: 3.1 10*3/uL (ref 0.7–4.0)
MCH: 27.4 pg (ref 26.0–34.0)
MCHC: 30.9 g/dL (ref 30.0–36.0)
MCV: 88.6 fL (ref 80.0–100.0)
Monocytes Absolute: 0.6 10*3/uL (ref 0.1–1.0)
Monocytes Relative: 7 %
Neutro Abs: 5.3 10*3/uL (ref 1.7–7.7)
Neutrophils Relative %: 56 %
Platelets: 381 10*3/uL (ref 150–400)
RBC: 2.99 MIL/uL — ABNORMAL LOW (ref 4.22–5.81)
RDW: 15.8 % — ABNORMAL HIGH (ref 11.5–15.5)
WBC: 9.3 10*3/uL (ref 4.0–10.5)
nRBC: 0 % (ref 0.0–0.2)

## 2021-02-20 LAB — GLUCOSE, CAPILLARY
Glucose-Capillary: 103 mg/dL — ABNORMAL HIGH (ref 70–99)
Glucose-Capillary: 110 mg/dL — ABNORMAL HIGH (ref 70–99)
Glucose-Capillary: 96 mg/dL (ref 70–99)
Glucose-Capillary: 97 mg/dL (ref 70–99)

## 2021-02-20 LAB — BPAM RBC
Blood Product Expiration Date: 202209162359
Blood Product Expiration Date: 202209172359
ISSUE DATE / TIME: 202208132041
ISSUE DATE / TIME: 202208151359
Unit Type and Rh: 5100
Unit Type and Rh: 5100

## 2021-02-20 LAB — MAGNESIUM: Magnesium: 1.3 mg/dL — ABNORMAL LOW (ref 1.7–2.4)

## 2021-02-20 LAB — TYPE AND SCREEN
ABO/RH(D): O POS
Antibody Screen: NEGATIVE
Unit division: 0
Unit division: 0

## 2021-02-20 LAB — PHOSPHORUS: Phosphorus: 2.8 mg/dL (ref 2.5–4.6)

## 2021-02-20 MED ORDER — FOSFOMYCIN TROMETHAMINE 3 G PO PACK
3.0000 g | PACK | ORAL | Status: DC
  Administered 2021-02-21: 3 g via ORAL
  Filled 2021-02-20 (×3): qty 3

## 2021-02-20 MED ORDER — MAGNESIUM SULFATE 2 GM/50ML IV SOLN
2.0000 g | Freq: Once | INTRAVENOUS | Status: AC
  Administered 2021-02-20: 2 g via INTRAVENOUS
  Filled 2021-02-20: qty 50

## 2021-02-20 MED ORDER — APIXABAN 5 MG PO TABS
5.0000 mg | ORAL_TABLET | Freq: Two times a day (BID) | ORAL | Status: DC
  Administered 2021-02-20 – 2021-02-23 (×6): 5 mg via ORAL
  Filled 2021-02-20 (×6): qty 1

## 2021-02-20 MED ORDER — ASPIRIN EC 81 MG PO TBEC
81.0000 mg | DELAYED_RELEASE_TABLET | Freq: Every day | ORAL | Status: DC
  Administered 2021-02-21 – 2021-02-23 (×3): 81 mg via ORAL
  Filled 2021-02-20 (×3): qty 1

## 2021-02-20 NOTE — Progress Notes (Addendum)
PROGRESS NOTE    Matthew Williamson  WKG:[id number] DOB: May 11, 1946 DOA: 02/17/2021 PCP: Pcp, No   Chief Complaint  Patient presents with   Hematuria    Indwelling foley cath   Brief Narrative:  75 yo male with hx CVA, BPH with chronic urinary retention and chronic indwelling foley, atrial fibrillation on eliquis, T2DM, hx scrotal abscess s/p right orchiectomy who [facility] with several days of hematuria.    Of note, patient was hospitalized at an outside hospital in June 2022 due to concerns for pyelonephritis and cystitis secondary to ESBL E. coli with bacteremia.  Presentation was complicated by acute kidney injury.  Due to concerns for Fournier's gangrene patient was eventually transferred to [facility] where patient was evaluated by urology and Fournier's gangrene was ruled out.  CT imaging of the abdomen and pelvis revealed Matthew Williamson hypoattenuating lesion in the bladder and therefore patient underwent cystoscopy which revealed Matthew Williamson clot that was evacuated.  Patient completed [name] course of meropenem followed by transitioning patient to oral Bactrim which was then eventually transition to oral Augmentin due to development of hyperkalemia..  Renal injury resolved and patient was discharged.  Assessment & Plan:   Principal Problem:   Complicated UTI (urinary tract infection) Active Problems:   Gross hematuria   Acute metabolic encephalopathy   AF (paroxysmal atrial fibrillation) (HCC)   Type 2 diabetes mellitus without complication, without long-term current use of insulin (HCC)   BPH with urinary obstruction   Acute blood loss anemia   Essential hypertension  Discharge pending insurance auth   Complicated UTI  Hx ESBL E. Coli Currently on meropenem with hx ESBL Presented with gross hematuria Will follow urine cultures, with ESBL e. 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 in June Urology  recommending 7 days abx - > per discussion with pharmacy, planning for 3 doses fosfomycin   Acute Blood Loss Anemia 2/2 Gross Hematuria Baseline hb in 01/2021 appears to have been around 8 Transfused 2 unit pRBC - Hb stable today Urology consulted -> s/p CBI, now clamped - urine now clear yellow - ok for diet - per urology, ok for discharge from urologic standpoint (needs to follow with local urologist treatment of severe prostatic hyperplasia) Resuming eliquis/aspirin with resolution of hematuria  Acute Kidney Injury  Bilateral Hydroureteronephrosis  Distended Urinary bladder ? Misplacement of chronic indwelling foley catheter - obstruction of foley related to hematuria, clots? Removed/replaced  Follow renal function - creatinine improved Acei on hold Follow renal function after foley was replaced Will consider repeating renal US in Matthew Williamson few days (will discuss with urology prior to discharge)  History of CVA  Acute Metabolic Encephalopathy Seems to be close to baseline per family  They note he has hx of stroke and chronic difficulty with speech as well as chronic weakness (right sided) Aspirin on hold -> will resume with resolution of hematuria Continue lipitor  BPH with Urinary Obstruction Foley in place Follow with his primary urologists outpatient - plan was for prostatectomy in next 2 months Continue finasteride and tamsulosin   Atrial Fibrillation Resume eliquis for now, discussed with urology  T2DM SSI, follow A1c 6 in June 2022 Holding metformin  Hypertension Continue amlodipine Hold ace inhibitor  DVT prophylaxis: SCD Code Status: full Family Communication: daughter over the phone Disposition:   Status is: Inpatient  Remains inpatient appropriate because:Inpatient level of care appropriate due to severity of illness  Dispo: The patient  is from: SNF              Anticipated d/c is to: SNF              Patient currently is not medically stable to d/c.    Difficult to place patient No       Consultants:  urology  Procedures: none  Antimicrobials: Anti-infectives (From admission, onward)    Start     Dose/Rate Route Frequency Ordered Stop   02/18/21 1830  meropenem (MERREM) 1 g in sodium chloride 0.9 % 100 mL IVPB        1 g 200 mL/hr over 30 Minutes Intravenous Every 8 hours 02/18/21 1715     02/18/21 0045  meropenem (MERREM) 1 g in sodium chloride 0.9 % 100 mL IVPB  Status:  Discontinued        1 g 200 mL/hr over 30 Minutes Intravenous Every 12 hours 02/18/21 0044 02/18/21 1715   02/17/21 2200  cefTRIAXone (ROCEPHIN) 1 g in sodium chloride 0.9 % 100 mL IVPB  Status:  Discontinued        1 g 200 mL/hr over 30 Minutes Intravenous Every 24 hours 02/17/21 2156 02/17/21 2332          Subjective: No complaints  Objective: Vitals:   02/19/21 1958 02/20/21 0058 02/20/21 0544 02/20/21 0840  BP: 130/69 (!) 151/88 (!) 149/95 (!) 151/90  Pulse: (!) 54 (!) 56 72 70  Resp: 16 16 17 17   Temp: 98.5 F (36.9 C) 97.9 F (36.6 C) 98.2 F (36.8 C) 98.1 F (36.7 C)  TempSrc: Oral Oral Oral Oral  SpO2: 100% 100% 100% 100%  Weight:      Height:        Intake/Output Summary (Last 24 hours) at 02/20/2021 1835 Last data filed at 02/20/2021 1300 Gross per 24 hour  Intake 1800 ml  Output 1500 ml  Net 300 ml   Filed Weights   02/17/21 1736  Weight: 127 kg    Examination:  General: No acute distress. Cardiovascular: RRR Lungs: unlabored Abdomen: Soft, nontender, nondistended Neurological: R sided weakness, difficult to understand Skin: Warm and dry. No rashes or lesions. Extremities: No clubbing or cyanosis. No edema.    Data Reviewed: I have personally reviewed following labs and imaging studies  CBC: Recent Labs  Lab 02/17/21 1748 02/18/21 0230 02/18/21 0804 02/18/21 1547 02/19/21 0408 02/19/21 1930 02/20/21 0808  WBC 5.6 5.5 5.1 6.6 8.1  --  9.3  NEUTROABS 2.8  --   --   --  4.2  --  5.3  HGB 6.5* 7.4* 7.4*  7.6* 6.8* 7.9* 8.2*  HCT 21.6* 24.3* 24.1* 24.2* 21.8* 25.3* 26.5*  MCV 88.9 88.7 88.9 87.4 88.3  --  88.6  PLT 341 339 350 347 333  --  381    Basic Metabolic Panel: Recent Labs  Lab 02/17/21 1748 02/18/21 0230 02/18/21 1547 02/19/21 0408 02/20/21 0808  NA 135  --  141 141 141  K 4.4  --  4.0 3.8 3.5  CL 108  --  114* 113* 109  CO2 17*  --  20* 23 26  GLUCOSE 96  --  92 108* 109*  BUN 35*  --  17 13 7*  CREATININE 2.92*  --  1.14 0.95 0.79  CALCIUM 9.0  --  8.8* 8.4* 8.5*  MG  --  1.6* 1.3* 1.6* 1.3*  PHOS  --   --   --  2.8 2.8  GFR: Estimated Creatinine Clearance: 110 mL/min (by C-G formula based on SCr of 0.79 mg/dL).  Liver Function Tests: Recent Labs  Lab 02/18/21 1547 02/19/21 0408 02/20/21 0808  AST 15 20 19   ALT 12 13 17   ALKPHOS 50 51 58  BILITOT 1.0 0.8 1.0  PROT 6.4* 5.8* 6.4*  ALBUMIN 2.4* 2.2* 2.4*    CBG: Recent Labs  Lab 02/19/21 1720 02/20/21 0056 02/20/21 0542 02/20/21 1143 02/20/21 1742  GLUCAP 107* 110* 96 97 103*     Recent Results (from the past 240 hour(s))  Urine Culture     Status: Abnormal (Preliminary result)   Collection Time: 02/17/21 12:36 AM   Specimen: Urine, Clean Catch  Result Value Ref Range Status   Specimen Description URINE, CLEAN CATCH  Final   Special Requests NONE  Final   Culture (Janete Quilling)  Final    >=100,000 COLONIES/mL ESCHERICHIA COLI Confirmed Extended Spectrum Beta-Lactamase Producer (ESBL).  In bloodstream infections from ESBL organisms, carbapenems are preferred over piperacillin/tazobactam. They are shown to have Mallissa Lorenzen lower risk of mortality. >=100,000 COLONIES/mL ENTEROCOCCUS FAECIUM SUSCEPTIBILITIES TO FOLLOW Performed at [facility], [address] [place], [address] [place]    Report Status PENDING  Incomplete   Organism ID, Bacteria ESCHERICHIA COLI (Stesha Neyens)  Final      Susceptibility   Escherichia coli - MIC*    AMPICILLIN >=32 RESISTANT Resistant     CEFAZOLIN >=64 RESISTANT Resistant      CEFEPIME 4 INTERMEDIATE Intermediate     CEFTRIAXONE >=64 RESISTANT Resistant     CIPROFLOXACIN >=4 RESISTANT Resistant     GENTAMICIN >=16 RESISTANT Resistant     IMIPENEM <=0.25 SENSITIVE Sensitive     NITROFURANTOIN 64 INTERMEDIATE Intermediate     TRIMETH/SULFA <=20 SENSITIVE Sensitive     AMPICILLIN/SULBACTAM >=32 RESISTANT Resistant     PIP/TAZO <=4 SENSITIVE Sensitive     * >=100,000 COLONIES/mL ESCHERICHIA COLI  Resp Panel by RT-PCR (Flu Geryl Dohn&B, Covid) Nasopharyngeal Swab     Status: None   Collection Time: 02/17/21 11:29 PM   Specimen: Nasopharyngeal Swab; Nasopharyngeal(NP) swabs in vial transport medium  Result Value Ref Range Status   SARS Coronavirus 2 by RT PCR NEGATIVE NEGATIVE Final    Comment: (NOTE) SARS-CoV-2 target nucleic acids are NOT DETECTED.  The SARS-CoV-2 RNA is generally detectable in upper respiratory specimens during the acute phase of infection. The lowest concentration of SARS-CoV-2 viral copies this assay can detect is 138 copies/mL. Harley Mccartney negative result does not preclude SARS-Cov-2 infection and should not be used as the sole basis for treatment or other patient management decisions. Shomari Matusik negative result may occur with  improper specimen collection/handling, submission of specimen other than nasopharyngeal swab, presence of viral mutation(s) within the areas targeted by this assay, and inadequate number of viral copies(<138 copies/mL). Ezechiel Stooksbury negative result must be combined with clinical observations, patient history, and epidemiological information. The expected result is Negative.  Fact Sheet for Patients:  16109  Fact Sheet for Healthcare Providers:  02/19/21  This test is no t yet approved or cleared by the [employer] FDA and  has been authorized for detection and/or diagnosis of SARS-CoV-2 by FDA under an Emergency Use Authorization (EUA). This EUA will remain  in effect (meaning  this test can be used) for the duration of the COVID-19 declaration under Section 564(b)(1) of the Act, 21 U.S.C.section 360bbb-3(b)(1), unless the authorization is terminated  or revoked sooner.       Influenza Doyt Castellana by PCR NEGATIVE  NEGATIVE Final   Influenza B by PCR NEGATIVE NEGATIVE Final    Comment: (NOTE) The Xpert Xpress SARS-CoV-2/FLU/RSV plus assay is intended as an aid in the diagnosis of influenza from Nasopharyngeal swab specimens and should not be used as Namiyah Grantham sole basis for treatment. Nasal washings and aspirates are unacceptable for Xpert Xpress SARS-CoV-2/FLU/RSV testing.  Fact Sheet for Patients: https://www.fda.gov/media/152166/download  Fact Sheet for Healthcare Providers: SeriousBroker.ithttps://www.fda.gov/media/152162/download  This test is not yet approved or cleared by the [place] FDA and has been authorizeBloggerCourse.comd for detection and/or diagnosis of SARS-CoV-2 by FDA under an Emergency Use Authorization (EUA). This EUA will remain in effect (meaning this test can be used) for the duration of the COVID-19 declaration under Section 564(b)(1) of the Act, 21 U.S.C. section 360bbb-3(b)(1), unless the authorization is terminated or revoked.  Performed at [facility], [address] [place], [place] [address]   Culture, blood (routine x 2)     Status: None (Preliminary result)   Collection Time: 02/18/21  2:30 AM   Specimen: BLOOD LEFT ARM  Result Value Ref Range Status   Specimen Description BLOOD LEFT ARM  Final   Special Requests   Final    BOTTLES DRAWN AEROBIC AND ANAEROBIC Blood Culture results may not be optimal due to an excessive volume of blood received in culture bottles   Culture   Final    NO GROWTH 2 DAYS Performed at [facility], [address] [place], [place] [address]    Report Status PENDING  Incomplete  Culture, blood (routine x 2)     Status: None (Preliminary result)   Collection Time: 02/18/21  2:44 AM   Specimen: BLOOD LEFT HAND  Result  Value Ref Range Status   Specimen Description BLOOD LEFT HAND  Final   Special Requests   Final    BOTTLES DRAWN AEROBIC ONLY Blood Culture results may not be optimal due to an excessive volume of blood received in culture bottles   Culture   Final    NO GROWTH 2 DAYS Performed at [facility], [address] [place], [place] [address]    Report Status PENDING  Incomplete         Radiology Studies: No results found.      Scheduled Meds:  amLODipine  5 mg Oral Daily   atorvastatin  40 mg Oral Daily   finasteride  5 mg Oral Daily   insulin aspart  0-15 Units Subcutaneous Q6H   loratadine  10 mg Oral Daily   pantoprazole  40 mg Oral Daily   tamsulosin  0.4 mg Oral QPC breakfast   Continuous Infusions:  meropenem (MERREM) IV 1 g (02/20/21 1749)     LOS: 3 days    Time spent: over 30 min    Lacretia Nicksaldwell Powell, MD [facility]   To contact the attending provider between 7A-7P or the covering provider during after hours 7P-7A, please log into the web site [email or web address] and access using universal [place] password for that web site. If you do not have the password, please call the hospital operator.  02/20/2021, 6:35 PM

## 2021-02-20 NOTE — Evaluation (Signed)
Physical Therapy Evaluation Patient Details Name: Matthew Williamson MRN: [id number] DOB: [birth year] Today's Date: 02/20/2021   History of Present Illness  75 yo male presents to [facility] on 8/13 from [facility] with hematuria, complicated UTI. Recent hospitalization 12/2020 due to concerns for pyelonephritis and cystitis sectonary to ESBL E coli. PMH includes hypertension, BPH with chronic urinary tension and indwelling Foley catheter, paroxysmal atrial fibrillation on Eliquis, previous CVA (07/2020) , diabetes mellitus type 2, scrotal abscess status post right orchiectomy in 2021.  Clinical Impression   Pt presents with generalized weakness, impaired standing balance, impaired safety awareness, and decreased activity tolerance. Pt to benefit from acute PT to address deficits. Pt ambulated short hallway distance with use of RW, overall pt requiring min assist for mobility tasks. PT to progress mobility as tolerated, and will continue to follow acutely.      Follow Up Recommendations SNF (return to [facility])    Equipment Recommendations  None recommended by PT    Recommendations for Other Services       Precautions / Restrictions Precautions Precautions: Fall Restrictions Weight Bearing Restrictions: No      Mobility  Bed Mobility Overal bed mobility: Needs Assistance Bed Mobility: Supine to Sit     Supine to sit: Min assist;HOB elevated     General bed mobility comments: light assist for completion of trunk elevation, steadying. Increased time, use of bedrails.    Transfers Overall transfer level: Needs assistance Equipment used: Rolling walker (2 wheeled) Transfers: Sit to/from Stand Sit to Stand: Min assist         General transfer comment: min assist to rise when moving sit>stand and controlled descent when moving stand>sit. Verbal cuing for hand placement when rising/sitting.  Ambulation/Gait Ambulation/Gait assistance: Min assist Gait Distance (Feet): 75  Feet Assistive device: Rolling walker (2 wheeled) Gait Pattern/deviations: Step-through pattern;Decreased stride length;Trunk flexed Gait velocity: decr   General Gait Details: min assist to steady, guide pt's RW around obstacles in hallway. VC for upright posture, navigating hallway.  Stairs            Wheelchair Mobility    Modified Rankin (Stroke Patients Only)       Balance Overall balance assessment: Needs assistance Sitting-balance support: No upper extremity supported;Feet supported Sitting balance-Leahy Scale: Good     Standing balance support: Bilateral upper extremity supported;During functional activity Standing balance-Leahy Scale: Poor Standing balance comment: reliant on external assist                             Pertinent Vitals/Pain Pain Assessment: Faces Faces Pain Scale: No hurt Pain Intervention(s): Monitored during session    Home Living Family/patient expects to be discharged to:: Skilled nursing facility                      Prior Function Level of Independence: Needs assistance   Gait / Transfers Assistance Needed: pt reports walking with RW at rehab, works with PT  ADL's / Homemaking Assistance Needed: Pt states staff help him with dressing, bathing, deliver meals        Hand Dominance   Dominant Hand: Right    Extremity/Trunk Assessment   Upper Extremity Assessment Upper Extremity Assessment: Defer to OT evaluation    Lower Extremity Assessment Lower Extremity Assessment: Generalized weakness    Cervical / Trunk Assessment Cervical / Trunk Assessment: Normal  Communication   Communication: Expressive difficulties  Cognition Arousal/Alertness: Awake/alert Behavior During Therapy:  WFL for tasks assessed/performed Overall Cognitive Status: No family/caregiver present to determine baseline cognitive functioning                                 General Comments: Pt can move impulsively,  requires cues to wait for PT assist. Pt oriented to self only.      General Comments      Exercises     Assessment/Plan    PT Assessment Patient needs continued PT services  PT Problem List Decreased strength;Decreased mobility;Decreased safety awareness;Decreased activity tolerance;Decreased balance;Decreased knowledge of use of DME;Decreased cognition;Decreased knowledge of precautions       PT Treatment Interventions DME instruction;Therapeutic activities;Gait training;Therapeutic exercise;Patient/family education;Balance training;Functional mobility training;Neuromuscular re-education    PT Goals (Current goals can be found in the Care Plan section)  Acute Rehab PT Goals PT Goal Formulation: With patient Time For Goal Achievement: 03/06/21 Potential to Achieve Goals: Good    Frequency Min 2X/week   Barriers to discharge        Co-evaluation               AM-PAC PT "6 Clicks" Mobility  Outcome Measure Help needed turning from your back to your side while in a flat bed without using bedrails?: A Little Help needed moving from lying on your back to sitting on the side of a flat bed without using bedrails?: A Little Help needed moving to and from a bed to a chair (including a wheelchair)?: A Little Help needed standing up from a chair using your arms (e.g., wheelchair or bedside chair)?: A Little Help needed to walk in hospital room?: A Little Help needed climbing 3-5 steps with a railing? : A Lot 6 Click Score: 17    End of Session Equipment Utilized During Treatment: Gait belt Activity Tolerance: Patient tolerated treatment well Patient left: in chair;with call bell/phone within reach;with chair alarm set Nurse Communication: Mobility status PT Visit Diagnosis: Other abnormalities of gait and mobility (R26.89)    Time: 1353-1415 PT Time Calculation (min) (ACUTE ONLY): 22 min   Charges:   PT Evaluation $PT Eval Low Complexity: 1 Low          Quetzally Callas  S, PT DPT Acute Rehabilitation Services Pager [phone]  Office [phone]   Tyrone Apple E Christain [place] 02/20/2021, 3:05 PM

## 2021-02-20 NOTE — Progress Notes (Signed)
ANTICOAGULATION CONSULT NOTE - Initial Consult  Pharmacy Consult for Apixaban Indication:  Atrial Fibrillation  No Known Allergies  Patient Measurements: Height: 5\' 11"  (180.3 cm) Weight: 127 kg (280 lb) IBW/kg (Calculated) : 75.3  Vital Signs: Temp: 98.1 F (36.7 C) (08/16 0840) Temp Source: Oral (08/16 0840) BP: 151/90 (08/16 0840) Pulse Rate: 70 (08/16 0840)  Labs: Recent Labs    02/18/21 1547 02/19/21 0408 02/19/21 1930 02/20/21 0808  HGB 7.6* 6.8* 7.9* 8.2*  HCT 24.2* 21.8* 25.3* 26.5*  PLT 347 333  --  381  CREATININE 1.14 0.95  --  0.79    Estimated Creatinine Clearance: 110 mL/min (by C-G formula based on SCr of 0.79 mg/dL).   Medical History: Past Medical History:  Diagnosis Date   AF (paroxysmal atrial fibrillation) (HCC) 02/18/2021   BPH with urinary obstruction 02/18/2021   Essential hypertension 02/18/2021   Type 2 diabetes mellitus without complication, without long-term current use of insulin (HCC) 02/18/2021    Assessment: 75 yr old man with indwelling Foley catheter admitted 02/17/21 with hematuria; diagnosed with UTI (ESBL E coli, E faecium in urine cx; bld cx NGTD). Pt was receiving apixaban 5 mg PO BID at home for atrial fibrillation; apixaban  has been on hold this admission, due to hematuria and acute blood loss anemia. Pt was transfused 2 u PRBCs; hematuria has resolved and Hgb stable (H/H today 8.2/26.5). Pharmacy has been consulted to resume pt's home apixaban. Per RN, no bleeding observed.  75 yrs old, Scr 0.79, wt 127 kg  Goal of Therapy:  Prevention of stroke secondary to atrial fibrillation Monitor platelets by anticoagulation protocol: Yes   Plan:  Resume home apixaban regimen (5 mg PO BID) this evening Monitor CBC Monitor for bleeding  67, PharmD, BCPS, [facility] Clinical Pharmacist 02/20/2021,6:51 PM

## 2021-02-20 NOTE — Care Management Important Message (Signed)
Important Message  Patient Details  Name: Matthew Williamson MRN: [id number] Date of Birth: 1946-04-20   Medicare Important Message Given:  Yes  Precaution in place    [place] 02/20/2021, 3:42 PM

## 2021-02-20 NOTE — TOC Initial Note (Signed)
Transition of Care [facility]) - Initial/Assessment Note    Patient Details  Name: Matthew Williamson MRN: [id number] Date of Birth: [birth year]  [facility] CM/SW Contact:    Erin Sons, LCSW Phone Number: 02/20/2021, 1:06 PM  Clinical Narrative:                  CSW confirmed pt is short term at [facility]; will need insurance auth. CSW requested that [facility] start SNF auth as Monia Pouch frequently takes multiple days. Liaison confirmed they would start auth. Per MD, pt is medically stable for dc. PT/OT ordered.   CSW called pt daughter and updated her on dc/snf auth. She request update from MD. CSW notified MD.   Expected Discharge Plan: Skilled Nursing Facility Barriers to Discharge: Insurance Authorization   Patient Goals and CMS Choice        Expected Discharge Plan and Services Expected Discharge Plan: Skilled Nursing Facility     Post Acute Care Choice: Skilled Nursing Facility Living arrangements for the past 2 months: Skilled Nursing Facility                                      Prior Living Arrangements/Services Living arrangements for the past 2 months: Skilled Nursing Facility Lives with:: Facility Resident          Need for Family Participation in Patient Care: Yes (Comment) Care giver support system in place?: Yes (comment)      Activities of Daily Living      Permission Sought/Granted                  Emotional Assessment       Orientation: : Oriented to Self Alcohol / Substance Use: Not Applicable Psych Involvement: No (comment)  Admission diagnosis:  Anticoagulated [Z79.01] AKI (acute kidney injury) (HCC) [N17.9] Hematuria [R31.9] Bladder outlet obstruction [N32.0] Anemia, unspecified type [D64.9] Hematuria, unspecified type [R31.9] Indwelling Foley catheter present [Z97.8] Patient Active Problem List   Diagnosis Date Noted   Complicated UTI (urinary tract infection) 02/18/2021   Acute metabolic encephalopathy  02/18/2021   AF (paroxysmal atrial fibrillation) (HCC) 02/18/2021   Type 2 diabetes mellitus without complication, without long-term current use of insulin (HCC) 02/18/2021   BPH with urinary obstruction 02/18/2021   Acute blood loss anemia 02/18/2021   Essential hypertension 02/18/2021   Gross hematuria 02/17/2021   PCP:  Oneita Hurt, No Pharmacy:   [facility] [address], [place] - [address] [place] [address] Phone: [phone] Fax: [phone]     Social Determinants of Health (SDOH) Interventions    Readmission Risk Interventions No flowsheet data found.

## 2021-02-21 LAB — CBC WITH DIFFERENTIAL/PLATELET
Abs Immature Granulocytes: 0.1 10*3/uL — ABNORMAL HIGH (ref 0.00–0.07)
Basophils Absolute: 0 10*3/uL (ref 0.0–0.1)
Basophils Relative: 0 %
Eosinophils Absolute: 0.4 10*3/uL (ref 0.0–0.5)
Eosinophils Relative: 4 %
HCT: 23.7 % — ABNORMAL LOW (ref 39.0–52.0)
Hemoglobin: 7.5 g/dL — ABNORMAL LOW (ref 13.0–17.0)
Immature Granulocytes: 1 %
Lymphocytes Relative: 34 %
Lymphs Abs: 3.8 10*3/uL (ref 0.7–4.0)
MCH: 27.9 pg (ref 26.0–34.0)
MCHC: 31.6 g/dL (ref 30.0–36.0)
MCV: 88.1 fL (ref 80.0–100.0)
Monocytes Absolute: 0.8 10*3/uL (ref 0.1–1.0)
Monocytes Relative: 7 %
Neutro Abs: 6.1 10*3/uL (ref 1.7–7.7)
Neutrophils Relative %: 54 %
Platelets: 347 10*3/uL (ref 150–400)
RBC: 2.69 MIL/uL — ABNORMAL LOW (ref 4.22–5.81)
RDW: 15.8 % — ABNORMAL HIGH (ref 11.5–15.5)
WBC: 11.2 10*3/uL — ABNORMAL HIGH (ref 4.0–10.5)
nRBC: 0 % (ref 0.0–0.2)

## 2021-02-21 LAB — COMPREHENSIVE METABOLIC PANEL
ALT: 17 U/L (ref 0–44)
AST: 20 U/L (ref 15–41)
Albumin: 2.2 g/dL — ABNORMAL LOW (ref 3.5–5.0)
Alkaline Phosphatase: 56 U/L (ref 38–126)
Anion gap: 7 (ref 5–15)
BUN: 12 mg/dL (ref 8–23)
CO2: 23 mmol/L (ref 22–32)
Calcium: 8 mg/dL — ABNORMAL LOW (ref 8.9–10.3)
Chloride: 108 mmol/L (ref 98–111)
Creatinine, Ser: 0.93 mg/dL (ref 0.61–1.24)
GFR, Estimated: >60 mL/min (ref >60)
Glucose, Bld: 115 mg/dL — ABNORMAL HIGH (ref 70–99)
Potassium: 3.5 mmol/L (ref 3.5–5.1)
Sodium: 138 mmol/L (ref 135–145)
Total Bilirubin: 0.6 mg/dL (ref 0.3–1.2)
Total Protein: 5.9 g/dL — ABNORMAL LOW (ref 6.5–8.1)

## 2021-02-21 LAB — URINE CULTURE: Culture: >=100000 — AB

## 2021-02-21 LAB — GLUCOSE, CAPILLARY
Glucose-Capillary: 103 mg/dL — ABNORMAL HIGH (ref 70–99)
Glucose-Capillary: 106 mg/dL — ABNORMAL HIGH (ref 70–99)
Glucose-Capillary: 108 mg/dL — ABNORMAL HIGH (ref 70–99)
Glucose-Capillary: 118 mg/dL — ABNORMAL HIGH (ref 70–99)
Glucose-Capillary: 89 mg/dL (ref 70–99)

## 2021-02-21 LAB — MAGNESIUM: Magnesium: 1.6 mg/dL — ABNORMAL LOW (ref 1.7–2.4)

## 2021-02-21 LAB — PHOSPHORUS: Phosphorus: 2.4 mg/dL — ABNORMAL LOW (ref 2.5–4.6)

## 2021-02-21 MED ORDER — MAGNESIUM SULFATE 2 GM/50ML IV SOLN
2.0000 g | Freq: Once | INTRAVENOUS | Status: AC
  Administered 2021-02-21: 2 g via INTRAVENOUS
  Filled 2021-02-21: qty 50

## 2021-02-21 MED ORDER — K PHOS MONO-SOD PHOS DI & MONO 155-852-130 MG PO TABS
250.0000 mg | ORAL_TABLET | Freq: Two times a day (BID) | ORAL | Status: AC
  Administered 2021-02-21 – 2021-02-22 (×4): 250 mg via ORAL
  Filled 2021-02-21 (×4): qty 1

## 2021-02-21 NOTE — Progress Notes (Addendum)
PROGRESS NOTE    Matthew Williamson  VKP:[id number] DOB: 19-Oct-1945 DOA: 02/17/2021 PCP: Pcp, No   Chief Complaint  Patient presents with   Hematuria    Indwelling foley cath    Brief Narrative: 75 year old male with history of CVA, BPH with chronic urinary retention and chronic indwelling Foley, A. fib on Eliquis, type 2 diabetes, history of scrotal abscess with right orchiectomy presented with several days of hematuria.  He was recently hospitalized in June due to concern for pyelonephritis and cystitis due to ESBL E. coli with bacteremia and presentation complicated by AKI Due to concerns for Fournier's gangrene patient was eventually transferred to [facility] where patient was evaluated by urology and Fournier's gangrene was ruled out.  CT imaging of the abdomen and pelvis revealed a hypoattenuating lesion in the bladder and therefore patient underwent cystoscopy which revealed a clot that was evacuated.  Patient completed a course of meropenem followed by transitioning patient to oral Bactrim which was then eventually transition to oral Augmentin due to development of hyperkalemia..  Renal injury resolved and patient was discharged  Patient was admitted seen by urology here placed on continuous bladder irrigation and hematuria resolved.  Urine culture grew ESBL and Enterococcus and pharmacy recommended transition to plazomicin x3 doses.  Seen by PT OT and patient is a skilled nursing facility. Urology advised outpatient follow-up and keep the Foley catheter in.  Foley is draining well, ultrasound 8/16 shows  distended urinary bladder with mild bilateral hydronephrosis, enlarged prostate. On admission he had a markedly distended urinary bladder, moderate bilateral hydroureteronephrosis.  PT OT has assessed a skilled nursing facility upon discharge.  Subjective:  Seen and examined this morning. Foley is draining well and clear. He has baseline speech difficulties alert awake, not in  distress. Resting comfortably denies any fever any flank pain abdominal pain or suprapubic pain  Assessment & Plan:  UTI with ESBL, Enterococcus/History of ESBL E. 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  CBI discontinued.  To continue Foley catheter.  Now transitioned to fosfomycin dose 1/3.  Admission he had marked distended urinary bladder and moderate bilateral hilar aorta nephrosis and ultrasound on repeat 8/16 shows distended urinary bladder and mild bilateral hydronephrosis-improving. There is plan to have prostatectomy in next 2 months.  Continue finasteride and tamsulosi. Will discuss with urology prior to discharge.  He has mild leukocytosis ensure its  improved prior to discharge repeat CBC in a.m.  Intake/Output Summary (Last 24 hours) at 02/21/2021 1024 Last data filed at 02/20/2021 1900 Gross per 24 hour  Intake 1500 ml  Output 650 ml  Net 850 ml    Acute blood loss anemia due to #1 gross hematuria status post 2 unit PRBC.  H&H at 7.5 g from 8.2 g.  Repeat in am. Recent Labs  Lab 02/18/21 1547 02/19/21 0408 02/19/21 1930 02/20/21 0808 02/21/21 0043  HGB 7.6* 6.8* 7.9* 8.2* 7.5*  HCT 24.2* 21.8* 25.3* 26.5* 23.7*    Acute kidney injury: Resolved.  Monitor.  Resume ACE inhibitor on discharge Recent Labs  Lab 02/17/21 1748 02/18/21 1547 02/19/21 0408 02/20/21 0808 02/21/21 0043  BUN 35* 17 13 7* 12  CREATININE 2.92* 1.14 0.95 0.79 0.93    A. fib on Eliquis: He is back on his anticoagulation.  History of CVA Acute metabolic encephalopathy: In the setting  of #1.  Mental status at baseline per family Chronic difficulty with his speech and chronic weakness on the right sided.  Back on aspirin, Lipitor and Eliquis   Type 2 diabetes, history  of scrotal abscess with right orchiectomy A1c 6 in June holding metformin continue SSI.  Resume metformin upon discharge blood sugar stable Recent Labs  Lab 02/20/21 0542 02/20/21 1143 02/20/21 1742 02/21/21 0022 02/21/21 0541  GLUCAP 96 97 103* 118* 108*    Essential hypertension: Blood pressure is stable.  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   Hypomagnesemia/hypophosphatemia: Replacement ordered  Diet Order             Diet regular Room service appropriate? Yes; Fluid consistency: Thin  Diet effective now                  Patient's Body mass index is 39.05 kg/m.  DVT prophylaxis: SCDs Start: 02/18/21 0043 Eliquis Code Status:   Code Status: Full Code  Family Communication: plan of care discussed with patient at bedside. Status is: Inpatient  Remains inpatient appropriate because:Inpatient level of care appropriate due to severity of illness  Dispo: The patient is from: Home              Anticipated d/c is to: SNF              Patient currently is not medically stable to d/c.   Difficult to place patient No  Unresulted Labs (From admission, onward)    None      Medications reviewed:  Scheduled Meds:  amLODipine  5 mg Oral Daily   apixaban  5 mg Oral BID   aspirin EC  81 mg Oral Daily   atorvastatin  40 mg Oral Daily   finasteride  5 mg Oral Daily   fosfomycin  3 g Oral Q48H   insulin aspart  0-15 Units Subcutaneous Q6H   loratadine  10 mg Oral Daily   pantoprazole  40 mg Oral Daily   phosphorus  250 mg Oral BID   tamsulosin  0.4 mg Oral QPC breakfast   Continuous Infusions:  magnesium sulfate bolus IVPB     Consultants:see note  Procedures:see note Antimicrobials: Anti-infectives (From admission, onward)    Start     Dose/Rate Route Frequency Ordered Stop   02/21/21 0600  fosfomycin (MONUROL) packet 3 g        3 g Oral Every 48 hours 02/20/21 1848 02/27/21 0559   02/18/21 1830   meropenem (MERREM) 1 g in sodium chloride 0.9 % 100 mL IVPB  Status:  Discontinued        1 g 200 mL/hr over 30 Minutes Intravenous Every 8 hours 02/18/21 1715 02/20/21 1846   02/18/21 0045  meropenem (MERREM) 1 g in sodium chloride 0.9 % 100 mL IVPB  Status:  Discontinued        1 g 200 mL/hr over 30 Minutes Intravenous Every 12 hours 02/18/21 0044 02/18/21 1715   02/17/21 2200  cefTRIAXone (ROCEPHIN) 1 g in sodium chloride 0.9 % 100 mL IVPB  Status:  Discontinued        1 g 200 mL/hr over 30 Minutes Intravenous Every 24 hours 02/17/21 2156 02/17/21 2332      Culture/Microbiology    Component Value Date/Time   SDES BLOOD LEFT HAND 02/18/2021 0244   SPECREQUEST  02/18/2021 0244    BOTTLES DRAWN AEROBIC ONLY Blood Culture results may not be  optimal due to an excessive volume of blood received in culture bottles   CULT  02/18/2021 0244    NO GROWTH 2 DAYS Performed at [facility], [address], [place], [place] [address]    REPTSTATUS PENDING 02/18/2021 0244    Other culture-see note  Objective: Vitals: Today's Vitals   02/20/21 2033 02/20/21 2111 02/21/21 0025 02/21/21 0544  BP: (!) 121/58  117/65 (!) 147/82  Pulse: 60  65 (!) 53  Resp: Temp: 98.3 F (36.8 C)  98.5 F (36.9 C) (!) 97.5 F (36.4 C)  TempSrc: Oral  Oral Oral  SpO2: 100%  100% 100%  Weight:      Height:      PainSc:  Asleep      Intake/Output Summary (Last 24 hours) at 02/21/2021 1013 Last data filed at 02/20/2021 1900 Gross per 24 hour  Intake 1500 ml  Output 650 ml  Net 850 ml   Filed Weights   02/17/21 1736  Weight: 127 kg   Weight change:   Intake/Output from previous day: 08/16 0701 - 08/17 0700 In: 1800 [P.O.:1800] Out: 1200 [Urine:1200] Intake/Output this shift: No intake/output data recorded. Filed Weights   02/17/21 1736  Weight: 127 kg   Examination: General exam: Alert awake not in acute distress baseline speech issues.   HEENT:Oral mucosa moist, Ear/Nose  WNL grossly,dentition normal. Respiratory system: bilaterally diminished,no use of accessory muscle, non tender. Cardiovascular system: S1 & S2 +,No JVD. Gastrointestinal system: Abdomen soft, NT,ND, BS+. Nervous System:Alert, awake, right-sided weakness, difficult understand speech  Extremities: no edema, distal peripheral pulses palpable.  Skin: No rashes,no icterus. MSK: Normal muscle bulk,tone, power Data Reviewed: I have personally reviewed following labs and imaging studies CBC: Recent Labs  Lab 02/17/21 1748 02/18/21 0230 02/18/21 0804 02/18/21 1547 02/19/21 0408 02/19/21 1930 02/20/21 0808 02/21/21 0043  WBC 5.6   < > 5.1 6.6 8.1  --  9.3 11.2*  NEUTROABS 2.8  --   --   --  4.2  --  5.3 6.1  HGB 6.5*   < > 7.4* 7.6* 6.8* 7.9* 8.2* 7.5*  HCT 21.6*   < > 24.1* 24.2* 21.8* 25.3* 26.5* 23.7*  MCV 88.9   < > 88.9 87.4 88.3  --  88.6 88.1  PLT 341   < > 350 347 333  --  381 347   < > = values in this interval not displayed.   Basic Metabolic Panel: Recent Labs  Lab 02/17/21 1748 02/18/21 0230 02/18/21 1547 02/19/21 0408 02/20/21 0808 02/21/21 0043  NA 135  --  141 141 141 138  K 4.4  --  4.0 3.8 3.5 3.5  CL 108  --  114* 113* 109 108  CO2 17*  --  20* GLUCOSE 96  --  92 108* 109* 115*  BUN 35*  --  17 13 7* 12  CREATININE 2.92*  --  1.14 0.95 0.79 0.93  CALCIUM 9.0  --  8.8* 8.4* 8.5* 8.0*  MG  --  1.6* 1.3* 1.6* 1.3* 1.6*  PHOS  --   --   --  2.8 2.8 2.4*   GFR: Estimated Creatinine Clearance: 94.6 mL/min (by C-G formula based on SCr of 0.93 mg/dL). Liver Function Tests: Recent Labs  Lab 02/18/21 1547 02/19/21 0408 02/20/21 0808 02/21/21 0043  AST ALT ALKPHOS 50 51 58 56  BILITOT 1.0 0.8  1.0 0.6  PROT 6.4* 5.8* 6.4* 5.9*  ALBUMIN 2.4* 2.2* 2.4* 2.2*   No results for input(s): LIPASE, AMYLASE in the last 168 hours. No results for input(s): AMMONIA in the last 168 hours. Coagulation Profile: No results for  input(s): INR, PROTIME in the last 168 hours. Cardiac Enzymes: No results for input(s): CKTOTAL, CKMB, CKMBINDEX, TROPONINI in the last 168 hours. BNP (last 3 results) No results for input(s): PROBNP in the last 8760 hours. HbA1C: No results for input(s): HGBA1C in the last 72 hours. CBG: Recent Labs  Lab 02/20/21 0542 02/20/21 1143 02/20/21 1742 02/21/21 0022 02/21/21 0541  GLUCAP 96 97 103* 118* 108*   Lipid Profile: No results for input(s): CHOL, HDL, LDLCALC, TRIG, CHOLHDL, LDLDIRECT in the last 72 hours. Thyroid Function Tests: No results for input(s): TSH, T4TOTAL, FREET4, T3FREE, THYROIDAB in the last 72 hours. Anemia Panel: No results for input(s): VITAMINB12, FOLATE, FERRITIN, TIBC, IRON, RETICCTPCT in the last 72 hours. Sepsis Labs: Recent Labs  Lab 02/18/21 0230  LATICACIDVEN 1.6    Recent Results (from the past 240 hour(s))  Urine Culture     Status: Abnormal   Collection Time: 02/17/21 12:36 AM   Specimen: Urine, Clean Catch  Result Value Ref Range Status   Specimen Description URINE, CLEAN CATCH  Final   Special Requests   Final    NONE Performed at [facility], [address], [place], [place] [address]    Culture (A)  Final    >=100,000 COLONIES/mL ESCHERICHIA COLI Confirmed Extended Spectrum Beta-Lactamase Producer (ESBL).  In bloodstream infections from ESBL organisms, carbapenems are preferred over piperacillin/tazobactam. They are shown to have a lower risk of mortality. >=100,000 COLONIES/mL ENTEROCOCCUS FAECIUM VANCOMYCIN RESISTANT ENTEROCOCCUS    Report Status 02/21/2021 FINAL  Final   Organism ID, Bacteria ESCHERICHIA COLI (A)  Final   Organism ID, Bacteria ENTEROCOCCUS FAECIUM (A)  Final      Susceptibility   Escherichia coli - MIC*    AMPICILLIN >=32 RESISTANT Resistant     CEFAZOLIN >=64 RESISTANT Resistant     CEFEPIME 4 INTERMEDIATE Intermediate     CEFTRIAXONE >=64 RESISTANT Resistant     CIPROFLOXACIN >=4 RESISTANT Resistant      GENTAMICIN >=16 RESISTANT Resistant     IMIPENEM <=0.25 SENSITIVE Sensitive     NITROFURANTOIN 64 INTERMEDIATE Intermediate     TRIMETH/SULFA <=20 SENSITIVE Sensitive     AMPICILLIN/SULBACTAM >=32 RESISTANT Resistant     PIP/TAZO <=4 SENSITIVE Sensitive     * >=100,000 COLONIES/mL ESCHERICHIA COLI   Enterococcus faecium - MIC*    AMPICILLIN >=32 RESISTANT Resistant     NITROFURANTOIN 64 INTERMEDIATE Intermediate     VANCOMYCIN >=32 RESISTANT Resistant     LINEZOLID 2 SENSITIVE Sensitive     * >=100,000 COLONIES/mL ENTEROCOCCUS FAECIUM  Resp Panel by RT-PCR (Flu A&B, Covid) Nasopharyngeal Swab     Status: None   Collection Time: 02/17/21 11:29 PM   Specimen: Nasopharyngeal Swab; Nasopharyngeal(NP) swabs in vial transport medium  Result Value Ref Range Status   SARS Coronavirus 2 by RT PCR NEGATIVE NEGATIVE Final    Comment: (NOTE) SARS-CoV-2 target nucleic acids are NOT DETECTED.  The SARS-CoV-2 RNA is generally detectable in upper respiratory specimens during the acute phase of infection. The lowest concentration of SARS-CoV-2 viral copies this assay can detect is 138 copies/mL. A negative result does not preclude SARS-Cov-2 infection and should not be used as the sole basis for treatment or other patient management decisions. A  negative result may occur with  improper specimen collection/handling, submission of specimen other than nasopharyngeal swab, presence of viral mutation(s) within the areas targeted by this assay, and inadequate number of viral copies(<138 copies/mL). A negative result must be combined with clinical observations, patient history, and epidemiological information. The expected result is Negative.  Fact Sheet for Patients:  BloggerCourse.com  Fact Sheet for Healthcare Providers:  SeriousBroker.it  This test is no t yet approved or cleared by the [place] FDA and  has been authorized for detection  and/or diagnosis of SARS-CoV-2 by FDA under an Emergency Use Authorization (EUA). This EUA will remain  in effect (meaning this test can be used) for the duration of the COVID-19 declaration under Section 564(b)(1) of the Act, 21 U.S.C.section 360bbb-3(b)(1), unless the authorization is terminated  or revoked sooner.       Influenza A by PCR NEGATIVE NEGATIVE Final   Influenza B by PCR NEGATIVE NEGATIVE Final    Comment: (NOTE) The Xpert Xpress SARS-CoV-2/FLU/RSV plus assay is intended as an aid in the diagnosis of influenza from Nasopharyngeal swab specimens and should not be used as a sole basis for treatment. Nasal washings and aspirates are unacceptable for Xpert Xpress SARS-CoV-2/FLU/RSV testing.  Fact Sheet for Patients: BloggerCourse.com  Fact Sheet for Healthcare Providers: SeriousBroker.it  This test is not yet approved or cleared by the [place] FDA and has been authorized for detection and/or diagnosis of SARS-CoV-2 by FDA under an Emergency Use Authorization (EUA). This EUA will remain in effect (meaning this test can be used) for the duration of the COVID-19 declaration under Section 564(b)(1) of the Act, 21 U.S.C. section 360bbb-3(b)(1), unless the authorization is terminated or revoked.  Performed at [facility], [address] [place], [place] [address]   Culture, blood (routine x 2)     Status: None (Preliminary result)   Collection Time: 02/18/21  2:30 AM   Specimen: BLOOD LEFT ARM  Result Value Ref Range Status   Specimen Description BLOOD LEFT ARM  Final   Special Requests   Final    BOTTLES DRAWN AEROBIC AND ANAEROBIC Blood Culture results may not be optimal due to an excessive volume of blood received in culture bottles   Culture   Final    NO GROWTH 2 DAYS Performed at [facility], [address], [place], [place] [address]    Report Status PENDING  Incomplete  Culture, blood  (routine x 2)     Status: None (Preliminary result)   Collection Time: 02/18/21  2:44 AM   Specimen: BLOOD LEFT HAND  Result Value Ref Range Status   Specimen Description BLOOD LEFT HAND  Final   Special Requests   Final    BOTTLES DRAWN AEROBIC ONLY Blood Culture results may not be optimal due to an excessive volume of blood received in culture bottles   Culture   Final    NO GROWTH 2 DAYS Performed at [facility], [address], [place], [place] [address]    Report Status PENDING  Incomplete     Radiology Studies: US RENAL  Result Date: 02/20/2021 CLINICAL DATA:  Hydronephrosis EXAM: RENAL / URINARY TRACT ULTRASOUND COMPLETE COMPARISON:  CT 02/17/2021 FINDINGS: Right Kidney: Renal measurements: 11.6 x 6.7 x 6.5 cm = volume: 265. 8 mL. Echogenicity within normal limits. No mass. Mild right hydronephrosis. Left Kidney: Renal measurements: 11.6 x 6.8 x 5.7 cm = volume: 238.4 mL. Echogenicity within normal limits. Cyst at the midpole  measuring 16 mm. Mild hydronephrosis. Bladder: Distended urinary bladder. Other: Mass at the base of the bladder presumably represents enlarged prostate. IMPRESSION: 1. Mild bilateral hydronephrosis with distended urinary bladder 2. Enlarged prostate Electronically Signed   By: Jasmine Pang M.D.   On: 02/20/2021 20:06     LOS: 4 days   Lanae Boast, MD [facility]  02/21/2021, 10:13 AM

## 2021-02-22 ENCOUNTER — Inpatient Hospital Stay (HOSPITAL_COMMUNITY): Payer: Medicare HMO

## 2021-02-22 LAB — CBC
HCT: 26.4 % — ABNORMAL LOW (ref 39.0–52.0)
Hemoglobin: 8.3 g/dL — ABNORMAL LOW (ref 13.0–17.0)
MCH: 27.6 pg (ref 26.0–34.0)
MCHC: 31.4 g/dL (ref 30.0–36.0)
MCV: 87.7 fL (ref 80.0–100.0)
Platelets: 400 10*3/uL (ref 150–400)
RBC: 3.01 MIL/uL — ABNORMAL LOW (ref 4.22–5.81)
RDW: 15.9 % — ABNORMAL HIGH (ref 11.5–15.5)
WBC: 10.3 10*3/uL (ref 4.0–10.5)
nRBC: 0 % (ref 0.0–0.2)

## 2021-02-22 LAB — BASIC METABOLIC PANEL
Anion gap: 11 (ref 5–15)
BUN: 9 mg/dL (ref 8–23)
CO2: 23 mmol/L (ref 22–32)
Calcium: 8.9 mg/dL (ref 8.9–10.3)
Chloride: 105 mmol/L (ref 98–111)
Creatinine, Ser: 1.05 mg/dL (ref 0.61–1.24)
GFR, Estimated: >60 mL/min (ref >60)
Glucose, Bld: 118 mg/dL — ABNORMAL HIGH (ref 70–99)
Potassium: 3.4 mmol/L — ABNORMAL LOW (ref 3.5–5.1)
Sodium: 139 mmol/L (ref 135–145)

## 2021-02-22 LAB — GLUCOSE, CAPILLARY
Glucose-Capillary: 120 mg/dL — ABNORMAL HIGH (ref 70–99)
Glucose-Capillary: 118 mg/dL — ABNORMAL HIGH (ref 70–99)
Glucose-Capillary: 126 mg/dL — ABNORMAL HIGH (ref 70–99)
Glucose-Capillary: 130 mg/dL — ABNORMAL HIGH (ref 70–99)

## 2021-02-22 LAB — PHOSPHORUS: Phosphorus: 3.4 mg/dL (ref 2.5–4.6)

## 2021-02-22 LAB — MAGNESIUM: Magnesium: 1.8 mg/dL (ref 1.7–2.4)

## 2021-02-22 MED ORDER — CHLORHEXIDINE GLUCONATE CLOTH 2 % EX PADS
6.0000 | MEDICATED_PAD | Freq: Every day | CUTANEOUS | Status: DC
  Administered 2021-02-22 – 2021-02-23 (×2): 6 via TOPICAL

## 2021-02-22 NOTE — Progress Notes (Signed)
Urology Progress Note    75 y.o. male with PMH A fib on Eliquis, CVA, BPH with indwelling catheter (patient of Dr. Althea Grimmer) presenting with gross hematuria and clot retention.  History of emphysematous cystitis, urosepsis, gross hematuria requiring [facility] to [facility] in late June 2022.  Urine cleared. Not on CBI. Clear yellow.  Repeat RUS with distended bladder for which we were called to evaluate.   Subjective: Called to evaluate foley as above. Foley was not flushing, though was draining clear yellow. I deflated the balloon and advanced the foley and hubbed the catheter. Upon which it flushed easily. I proceeded to put 30cc sterile water in the balloon. He then drained over 1 L immediately. Expressed to nursing that his catheter should not be on traction and care should be taken so that the foley tubing doesn't get kinked. Additionally, if a RUS is ordered, his catheter should not be clamped prior.   Objective: Vital signs in last 24 hours: Temp:  [97.6 F (36.4 C)-98.3 F (36.8 C)] 98.3 F (36.8 C) (08/18 1500) Pulse Rate:  [53-81] 56 (08/18 1500) Resp:  [15-18] 16 (08/18 1500) BP: (129-158)/(79-105) 134/105 (08/18 1500) SpO2:  [100 %] 100 % (08/18 1500)  Intake/Output from previous day: 08/17 0701 - 08/18 0700 In: 650 [P.O.:600; IV Piggyback:50] Out: 2600 [Urine:2600] Intake/Output this shift: No intake/output data recorded.  Physical Exam:  General: Alert but intermittently confused CV: Regular rate Lungs: No increased work of breathing Abdomen: Soft and nontender. GU: Foley in place draining clear yellow urine Ext: NT, No erythema  Lab Results: Recent Labs    02/20/21 0808 02/21/21 0043 02/22/21 0343  HGB 8.2* 7.5* 8.3*  HCT 26.5* 23.7* 26.4*   Recent Labs    02/21/21 0043 02/22/21 0343  NA 138 139  K 3.5 3.4*  CL 108 105  CO2 23 23  GLUCOSE 115* 118*  BUN 12 9  CREATININE 0.93 1.05  CALCIUM 8.0* 8.9    Studies/Results: US RENAL  Result Date:  02/22/2021 CLINICAL DATA:  Hydronephrosis. EXAM: RENAL / URINARY TRACT ULTRASOUND COMPLETE COMPARISON:  Ultrasound renal 02/20/2021 FINDINGS: Right Kidney: Renal measurements: 11.9 x 8.3 x 8.2 cm = volume: 421 mL. Echogenicity within normal limits. No mass. Persistent mild hydronephrosis visualized. Left Kidney: Renal measurements: 12 x 8.1 x 7.5 cm = volume: 380 mL. Echogenicity within normal limits. Redemonstration of a 1.5 x 1.6 x 1.5 cm simple cystic lesion. No associated mural nodularity, septation, vascularity. No solid mass. Persistent mild hydronephrosis visualized. Urinary bladder: Distended with urine appears normal for degree of bladder distention. Other: Cholelithiasis. Enlarged prostate measuring 6 x 5.3 x 5.2 cm which corresponds to a volume of 86 mL. IMPRESSION: 1. Persistent mild bilateral hydronephrosis as well as distended urinary bladder with urine. 2. Cholelithiasis. 3. Prostatomegaly. Electronically Signed   By: Tish Frederickson M.D.   On: 02/22/2021 15:31    Assessment/Plan:  75 y.o. male with PMH A fib on Eliquis, CVA, BPH with indwelling catheter (patient of Dr. Althea Grimmer) presenting with gross hematuria and clot retention.  History of emphysematous cystitis, urosepsis, gross hematuria requiring [facility] to [facility] in late June 2022.  Urine is clear yellow.  On Fosfomycin.   Foley interrogated as above. his catheter should not be on traction and care should be taken so that the foley tubing doesn't get kinked. Additionally, if a RUS is ordered, his catheter should not be clamped prior.   -Patient can be discharged with his catheter in place from urologic standpoint.  He  should follow-up with his local urologist for continued treatment of his severe prostatic hyperplasia.  Dispo: per primary team.    LOS: 5 days

## 2021-02-22 NOTE — Plan of Care (Signed)
  Problem: Nutrition: Goal: Adequate nutrition will be maintained Outcome: Progressing   

## 2021-02-22 NOTE — TOC Progression Note (Signed)
Transition of Care [facility]) - Progression Note    Patient Details  Name: Matthew Williamson MRN: [id number] Date of Birth: Sep 18, 1945  Transition of [facility]) CM/SW Contact  Erin Sons, [place] Phone Number: 02/22/2021, 4:32 PM  Clinical Narrative:     [facility] liaison notified that auth received. [facility] is not able to receive today with DC not being finalized early enough. Pt will DC tomorrow. Pt daughter is notified.   Expected Discharge Plan: Skilled Nursing Facility Barriers to Discharge: Insurance Authorization  Expected Discharge Plan and Services Expected Discharge Plan: Skilled Nursing Facility     Post Acute Care Choice: Skilled Nursing Facility Living arrangements for the past 2 months: Skilled Nursing Facility                                       Social Determinants of Health (SDOH) Interventions    Readmission Risk Interventions No flowsheet data found.

## 2021-02-22 NOTE — Discharge Summary (Signed)
Physician Discharge Summary  Belal Scallon TOI:[id number] DOB: December 08, 1945 DOA: 02/17/2021  PCP: Pcp, No  Admit date: 02/17/2021 Discharge date: 02/23/2021  Admitted From: snf Disposition:  snf  Recommendations for Outpatient Follow-up:  Follow up with PCP in 1-2 weeks F/u with your Urology in 1 wk-10 days at [facility] Please obtain BMP/CBC in one week  Home Health:no  Equipment/Devices: none  Discharge Condition: Stable Code Status:   Code Status: Full Code Diet recommendation:  Diet Order             Diet regular Room service appropriate? Yes; Fluid consistency: Thin  Diet effective now                   Brief/Interim Summary: 75 year old male with history of CVA, BPH with chronic urinary retention and chronic indwelling Foley, A. fib on Eliquis, type 2 diabetes, history of scrotal abscess with right orchiectomy presented with several days of hematuria.He was recently hospitalized in June due to concern for pyelonephritis and cystitis due to ESBL E. coli with bacteremia and presentation complicated by AKI Due to concerns for Fournier's gangrene patient was eventually transferred to [facility] where patient was evaluated by urology and Fournier's gangrene was ruled out.CT imaging of the abdomen and pelvis revealed a hypoattenuating lesion in the bladder and therefore patient underwent cystoscopy which revealed a clot that was evacuated.  Patient completed a course of meropenem followed by transitioning patient to oral Bactrim which was then eventually transition to oral Augmentin due to development of hyperkalemia..  Renal injury resolved and patient was discharged Patient was admitted seen by urology here placed on continuous bladder irrigation and hematuria resolved.  Urine culture grew ESBL and Enterococcus and pharmacy recommended transition to plazomicin x3 doses.  Seen by PT OT and patient is a skilled nursing facility. Urology advised outpatient follow-up and keep the  Foley catheter in.Foley is draining well, ultrasound 8/16 shows  distended urinary bladder with mild bilateral hydronephrosis,enlarged prostate. On admission he had a markedly distended urinary bladder, moderate bilateral hydroureteronephrosis.He is having good UOP had 2600 ml last night. PT OT has assessed a skilled nursing facility upon discharge.  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 At this time patient afebrile, hematuria resolved.  CBI discontinued.  Was on meropenem 8/13- 8/16 and adqeuately treated-transitioned to fosfomycin 8/16. On presentation patient never had fever no leukocytosis. ??  If true infection versus colonization given his chronic indwelling Foley-but empirically treated with IV antibiotic given his previous  history.Since antibiotic change patient seems to be responding very well no dysuria, no hematuria no fever no leukocytosis and wbc counts further improving.I discussed with Dr. Earlene Plater from infectious disease who reviewed the chart-and agrees that patient has been adequately treated with meropenem and now also getting fosfomycin which he can complete at a skilled nursing facility monitor or further additional agent as patient is improving-he feels fosfomycin will have some activity against VRE.  On admission he had marked distended urinary bladder and moderate bilateral hydronephrosis and ultrasound on repeat 8/16 shows distended urinary bladder and mild bilateral hydronephrosis-improving.  Repeat ultrasound again showed distended bladder seen by urology again 8/18-Foley was not flushing, urology during the balloon advised to Foley draining inflated volume was already told of urine  immediately drained it  appears Foley was also kinked and nursing had been clamping the Foley 1 hour prior to the ultrasound which made appearance of bladder distention.  Urology cleared the patient for discharge back to facility and outpatient follow-up with urologyThere is plan to have prostatectomy in next 2 months.Continue finasteride and tamsulosi. He had mild leukocytosis -and it is also resolved.  No urinary symptoms. Urine is clear.  Continue Foley catheter and follow-up with his primary urology as outpatient.  Recent Labs  Lab 02/18/21 0230 02/18/21 0804 02/19/21 0408 02/20/21 0808 02/21/21 0043 02/22/21 0343 02/23/21 1019  WBC 5.5   < > 8.1 9.3 11.2* 10.3 8.7  LATICACIDVEN 1.6  --   --   --   --   --   --    < > = values in this interval not displayed.    Acute blood loss anemia due to #1 gross hematuria status post 2 unit PRBC.  Had to b stable 8.3 gm on dc. Recent Labs  Lab 02/19/21 1930 02/20/21 0808 02/21/21 0043 02/22/21 0343 02/23/21 1019  HGB 7.9* 8.2* 7.5* 8.3* 8.8*  HCT 25.3* 26.5* 23.7* 26.4* 28.2*    Acute kidney injury: Resolved.  Monitor.  Resume ACE inhibitor on discharge Recent Labs  Lab 02/18/21 1547 02/19/21 0408 02/20/21 0808 02/21/21 0043 02/22/21 0343  BUN 17 13 7* 12 9  CREATININE 1.14 0.95 0.79 0.93 1.05    A. fib on Eliquis: Rate controlled, cont his anticoagulation.  History of CVA/Acute metabolic encephalopathy: In the setting of #1.  Mental status at baseline Chronic difficulty with his speech and chronic weakness on the right sided.  He is aspirin, Lipitor and Eliquis   history of scrotal abscess with right orchiectomy  Type 2 diabetes:A1c 6 in June Resume metformin upon discharge blood sugar stable Recent Labs  Lab 02/22/21 1220 02/22/21 1744 02/23/21 0007 02/23/21 0612 02/23/21 1107  GLUCAP 118* 130* 94 101* 156*    Essential hypertension: BP well controlled on amlodipine  Class II obesity with BMI 39 will benefit with weight loss PCP follow-up.    Hypomagnesemia/hypophosphatemia: Repleted  Consults: urology  Subjective: Alert awake oriented resting comfortably feels much better after Foley issues fixed yesterday.  Foley catheter draining well. He feels ready for discharge back to facility.  Discharge Exam: Vitals:   02/23/21 0614 02/23/21 0900  BP: 139/76 (!) 143/87  Pulse: 90 62  Resp: 17 18  Temp: 98.3 F (36.8 C) 97.9 F (36.6 C)  SpO2: 100% 100%   General: Pt is alert, awake, not in acute distress Cardiovascular: RRR, S1/S2 +, no rubs, no gallops Respiratory: CTA bilaterally, no wheezing, no rhonchi Abdominal: Soft, NT, ND, bowel sounds + Extremities: no edema, no cyanosis  Discharge Instructions  Discharge Instructions     Discharge instructions   Complete by: As directed    Please follow-up with urology for further plan on your prostate surgery  Monitor Foley output every day and closely and if Foley output is decreasing it will need to be flushed and placement needs to be checked contact the provider.  Please call call MD or return to ER for similar or worsening recurring problem that brought you to hospital or if any fever,nausea/vomiting,abdominal pain, uncontrolled pain, chest pain,  shortness of breath or any other alarming symptoms.  Please follow-up your doctor as instructed in a week time and call the office for appointment.  Please avoid alcohol, smoking, or any other illicit substance and maintain healthy habits including  taking your regular medications as prescribed.  You were cared for by a hospitalist during your hospital stay. If you have any questions about your discharge medications or the care you received while you were in the hospital after you are discharged, you can call the unit and ask to speak with the hospitalist on call if the hospitalist that took care of you is not available.  Once you are discharged, your primary care physician will handle any further medical issues. Please note  that NO REFILLS for any discharge medications will be authorized once you are discharged, as it is imperative that you return to your primary care physician (or establish a relationship with a primary care physician if you do not have one) for your aftercare needs so that they can reassess your need for medications and monitor your lab values   Increase activity slowly   Complete by: As directed       Allergies as of 02/23/2021   No Known Allergies      Medication List     STOP taking these medications    nitrofurantoin (macrocrystal-monohydrate) 100 MG capsule Commonly known as: MACROBID       TAKE these medications    Alogliptin Benzoate 25 MG Tabs Take 1 tablet by mouth at bedtime.   amLODipine 5 MG tablet Commonly known as: NORVASC Take 5 mg by mouth daily.   apixaban 5 MG Tabs tablet Commonly known as: ELIQUIS Take 5 mg by mouth 2 (two) times daily.   aspirin EC 81 MG tablet Take 81 mg by mouth daily. Swallow whole.   atorvastatin 40 MG tablet Commonly known as: LIPITOR Take 40 mg by mouth daily.   cyanocobalamin 1000 MCG tablet Take 1,000 mcg by mouth daily.   ferrous sulfate 325 (65 FE) MG tablet Take 325 mg by mouth in the morning and at bedtime.   finasteride 5 MG tablet Commonly known as: PROSCAR Take 5 mg by mouth daily.   folic acid 1 MG tablet Commonly known as: FOLVITE Take 1 mg by mouth daily.   fosfomycin 3 g Pack Commonly known as: MONUROL Take 3 g by mouth every 3 (three) days for 2 doses. Start taking on: February 24, 2021   lisinopril 10 MG tablet Commonly known as: ZESTRIL Take 10 mg by mouth daily.   loratadine 10 MG tablet Commonly known as: CLARITIN Take 10 mg by mouth daily.   metFORMIN 1000 MG tablet Commonly known as: GLUCOPHAGE Take 1,000 mg by mouth 2 (two) times daily with a meal.   pantoprazole 40 MG tablet Commonly known as: PROTONIX Take 40 mg by mouth daily.   sitaGLIPtin 100 MG tablet Commonly known as:  JANUVIA Take 100 mg by mouth daily.   tamsulosin 0.4 MG Caps capsule Commonly known as: FLOMAX Take 0.4 mg by mouth.   traZODone 50 MG tablet Commonly known as: DESYREL Take 50 mg by mouth at bedtime.        Follow-up Information     Timothy Lasso, MD Follow up in 1 week(s).   Specialty: Urology Contact information: [address] [place] [address] [phone]                No Known Allergies  The results of significant diagnostics from this hospitalization (including imaging, microbiology, ancillary and laboratory) are listed below for reference.    Microbiology: Recent Results (from the past 240 hour(s))  Urine Culture     Status: Abnormal   Collection Time: 02/17/21 12:36  AM   Specimen: Urine, Clean Catch  Result Value Ref Range Status   Specimen Description URINE, CLEAN CATCH  Final   Special Requests   Final    NONE Performed at [facility], [address] [place], [place] [address]    Culture (A)  Final    >=100,000 COLONIES/mL ESCHERICHIA COLI Confirmed Extended Spectrum Beta-Lactamase Producer (ESBL).  In bloodstream infections from ESBL organisms, carbapenems are preferred over piperacillin/tazobactam. They are shown to have a lower risk of mortality. >=100,000 COLONIES/mL ENTEROCOCCUS FAECIUM VANCOMYCIN RESISTANT ENTEROCOCCUS    Report Status 02/21/2021 FINAL  Final   Organism ID, Bacteria ESCHERICHIA COLI (A)  Final   Organism ID, Bacteria ENTEROCOCCUS FAECIUM (A)  Final      Susceptibility   Escherichia coli - MIC*    AMPICILLIN >=32 RESISTANT Resistant     CEFAZOLIN >=64 RESISTANT Resistant     CEFEPIME 4 INTERMEDIATE Intermediate     CEFTRIAXONE >=64 RESISTANT Resistant     CIPROFLOXACIN >=4 RESISTANT Resistant     GENTAMICIN >=16 RESISTANT Resistant     IMIPENEM <=0.25 SENSITIVE Sensitive     NITROFURANTOIN 64 INTERMEDIATE Intermediate     TRIMETH/SULFA <=20 SENSITIVE Sensitive     AMPICILLIN/SULBACTAM >=32  RESISTANT Resistant     PIP/TAZO <=4 SENSITIVE Sensitive     * >=100,000 COLONIES/mL ESCHERICHIA COLI   Enterococcus faecium - MIC*    AMPICILLIN >=32 RESISTANT Resistant     NITROFURANTOIN 64 INTERMEDIATE Intermediate     VANCOMYCIN >=32 RESISTANT Resistant     LINEZOLID 2 SENSITIVE Sensitive     * >=100,000 COLONIES/mL ENTEROCOCCUS FAECIUM  Resp Panel by RT-PCR (Flu A&B, Covid) Nasopharyngeal Swab     Status: None   Collection Time: 02/17/21 11:29 PM   Specimen: Nasopharyngeal Swab; Nasopharyngeal(NP) swabs in vial transport medium  Result Value Ref Range Status   SARS Coronavirus 2 by RT PCR NEGATIVE NEGATIVE Final    Comment: (NOTE) SARS-CoV-2 target nucleic acids are NOT DETECTED.  The SARS-CoV-2 RNA is generally detectable in upper respiratory specimens during the acute phase of infection. The lowest concentration of SARS-CoV-2 viral copies this assay can detect is 138 copies/mL. A negative result does not preclude SARS-Cov-2 infection and should not be used as the sole basis for treatment or other patient management decisions. A negative result may occur with  improper specimen collection/handling, submission of specimen other than nasopharyngeal swab, presence of viral mutation(s) within the areas targeted by this assay, and inadequate number of viral copies(<138 copies/mL). A negative result must be combined with clinical observations, patient history, and epidemiological information. The expected result is Negative.  Fact Sheet for Patients:  BloggerCourse.com  Fact Sheet for Healthcare Providers:  SeriousBroker.it  This test is no t yet approved or cleared by the [place] FDA and  has been authorized for detection and/or diagnosis of SARS-CoV-2 by FDA under an Emergency Use Authorization (EUA). This EUA will remain  in effect (meaning this test can be used) for the duration of the COVID-19 declaration under  Section 564(b)(1) of the Act, 21 U.S.C.section 360bbb-3(b)(1), unless the authorization is terminated  or revoked sooner.       Influenza A by PCR NEGATIVE NEGATIVE Final   Influenza B by PCR NEGATIVE NEGATIVE Final    Comment: (NOTE) The Xpert Xpress SARS-CoV-2/FLU/RSV plus assay is intended as an aid in the diagnosis of influenza from Nasopharyngeal swab specimens and should not be used as a sole basis for treatment. Nasal washings and  aspirates are unacceptable for Xpert Xpress SARS-CoV-2/FLU/RSV testing.  Fact Sheet for Patients: BloggerCourse.comhttps://www.fda.gov/media/152166/download  Fact Sheet for Healthcare Providers: SeriousBroker.ithttps://www.fda.gov/media/152162/download  This test is not yet approved or cleared by the [place] FDA and has been authorized for detection and/or diagnosis of SARS-CoV-2 by FDA under an Emergency Use Authorization (EUA). This EUA will remain in effect (meaning this test can be used) for the duration of the COVID-19 declaration under Section 564(b)(1) of the Act, 21 U.S.C. section 360bbb-3(b)(1), unless the authorization is terminated or revoked.  Performed at [facility], [address] [place], [place] [address]   Culture, blood (routine x 2)     Status: None   Collection Time: 02/18/21  2:30 AM   Specimen: BLOOD LEFT ARM  Result Value Ref Range Status   Specimen Description BLOOD LEFT ARM  Final   Special Requests   Final    BOTTLES DRAWN AEROBIC AND ANAEROBIC Blood Culture results may not be optimal due to an excessive volume of blood received in culture bottles   Culture   Final    NO GROWTH 5 DAYS Performed at [facility], [address] [place], [place] [address]    Report Status 02/23/2021 FINAL  Final  Culture, blood (routine x 2)     Status: None   Collection Time: 02/18/21  2:44 AM   Specimen: BLOOD LEFT HAND  Result Value Ref Range Status   Specimen Description BLOOD LEFT HAND  Final   Special Requests   Final    BOTTLES  DRAWN AEROBIC ONLY Blood Culture results may not be optimal due to an excessive volume of blood received in culture bottles   Culture   Final    NO GROWTH 5 DAYS Performed at [facility], [address] [place], [place] [address]    Report Status 02/23/2021 FINAL  Final    Procedures/Studies: US RENAL  Result Date: 02/22/2021 CLINICAL DATA:  Hydronephrosis. EXAM: RENAL / URINARY TRACT ULTRASOUND COMPLETE COMPARISON:  Ultrasound renal 02/20/2021 FINDINGS: Right Kidney: Renal measurements: 11.9 x 8.3 x 8.2 cm = volume: 421 mL. Echogenicity within normal limits. No mass. Persistent mild hydronephrosis visualized. Left Kidney: Renal measurements: 12 x 8.1 x 7.5 cm = volume: 380 mL. Echogenicity within normal limits. Redemonstration of a 1.5 x 1.6 x 1.5 cm simple cystic lesion. No associated mural nodularity, septation, vascularity. No solid mass. Persistent mild hydronephrosis visualized. Urinary bladder: Distended with urine appears normal for degree of bladder distention. Other: Cholelithiasis. Enlarged prostate measuring 6 x 5.3 x 5.2 cm which corresponds to a volume of 86 mL. IMPRESSION: 1. Persistent mild bilateral hydronephrosis as well as distended urinary bladder with urine. 2. Cholelithiasis. 3. Prostatomegaly. Electronically Signed   By: Tish FredericksonMorgane  Naveau M.D.   On: 02/22/2021 15:31   US RENAL  Result Date: 02/20/2021 CLINICAL DATA:  Hydronephrosis EXAM: RENAL / URINARY TRACT ULTRASOUND COMPLETE COMPARISON:  CT 02/17/2021 FINDINGS: Right Kidney: Renal measurements: 11.6 x 6.7 x 6.5 cm = volume: 265. 8 mL. Echogenicity within normal limits. No mass. Mild right hydronephrosis. Left Kidney: Renal measurements: 11.6 x 6.8 x 5.7 cm = volume: 238.4 mL. Echogenicity within normal limits. Cyst at the midpole measuring 16 mm. Mild hydronephrosis. Bladder: Distended urinary bladder. Other: Mass at the base of the bladder presumably represents enlarged prostate. IMPRESSION: 1. Mild bilateral  hydronephrosis with distended urinary bladder 2. Enlarged prostate Electronically Signed   By: Jasmine PangKim  Fujinaga M.D.   On: 02/20/2021 20:06   CT  Renal Stone Study  Result Date: 02/17/2021 CLINICAL DATA:  Hematuria.  Renal failure. EXAM: CT ABDOMEN AND PELVIS WITHOUT CONTRAST TECHNIQUE: Multidetector CT imaging of the abdomen and pelvis was performed following the standard protocol without IV contrast. COMPARISON:  Abdominopelvic CT 01/04/2021 FINDINGS: Lower chest: Mild cardiomegaly. Trace left pleural thickening without significant effusion. No focal airspace disease. Hepatobiliary: Borderline hepatic steatosis. No evidence of focal hepatic lesion on this unenhanced exam. Lamellated gallstone measuring 3.2 cm. No pericholecystic fat stranding. No biliary dilatation. Pancreas: No ductal dilatation or inflammation. Spleen: Normal in size without focal abnormality. Adrenals/Urinary Tract: No adrenal nodule. There is moderate bilateral hydroureteronephrosis. Both ureters are dilated to the bladder insertion. No renal or ureteral calculi bladder is again noted to be markedly distended extending above the umbilicus with bladder volume = 1700 cm^3. Previous air in the bladder wall is no longer seen. There is no definitive bladder wall thickening. Nodular mass effect on the bladder base from enlarged prostate gland again seen. 15 mm cyst in the posterior left kidney. Smaller low-density in the upper left kidney is too small to characterize but likely cyst. Tiny low-density in the medial right kidney is also too small to characterize. These lesions are not well assessed on prior exam due to motion. Stomach/Bowel: Partially distended stomach. There is no small bowel obstruction or inflammation. Normal air-filled appendix. Diffuse colonic diverticulosis. No diverticulitis. No colonic inflammation. Vascular/Lymphatic: Moderate aortic and iliac atherosclerosis. No aortic aneurysm. Scattered small retroperitoneal lymph nodes,  not enlarged by size criteria. No pelvic adenopathy. Reproductive: Enlarged prostate gland spans 6.5 x 6.4 x 5.8 cm (volume = 130 cm^3) and causes mass effect on the bladder base. Other: Trace perivesicular edema without significant free fluid. No free air. Tiny fat containing umbilical hernia. Minimal fat in the right inguinal canal. Musculoskeletal: Grade 1 anterolisthesis of L4 on L5 likely due to prominent facet degeneration. Multilevel degenerative change throughout the remainder of the lumbar spine. Degenerative change of both hips. Peripherally sclerotic lesion in the left iliac bone appears benign. There are no acute or suspicious osseous abnormalities. IMPRESSION: 1. Markedly distended urinary bladder. Moderate bilateral hydroureteronephrosis to the bladder insertion. No renal or ureteral calculi. Findings are likely secondary to bladder outlet obstruction given enlarged prostate gland causing mass effect on the bladder base. 2. Colonic diverticulosis without diverticulitis. 3. Cholelithiasis without gallbladder inflammation. Aortic Atherosclerosis (ICD10-I70.0). Electronically Signed   By: Narda Rutherford M.D.   On: 02/17/2021 20:06    Labs: BNP (last 3 results) No results for input(s): BNP in the last 8760 hours. Basic Metabolic Panel: Recent Labs  Lab 02/18/21 1547 02/19/21 0408 02/20/21 0808 02/21/21 0043 02/22/21 0343  NA 141 141 141 138 139  K 4.0 3.8 3.5 3.5 3.4*  CL 114* 113* 109 108 105  CO2 20* GLUCOSE 92 108* 109* 115* 118*  BUN 17 13 7* 12 9  CREATININE 1.14 0.95 0.79 0.93 1.05  CALCIUM 8.8* 8.4* 8.5* 8.0* 8.9  MG 1.3* 1.6* 1.3* 1.6* 1.8  PHOS  --  2.8 2.8 2.4* 3.4   Liver Function Tests: Recent Labs  Lab 02/18/21 1547 02/19/21 0408 02/20/21 0808 02/21/21 0043  AST ALT ALKPHOS 50 51 58 56  BILITOT 1.0 0.8 1.0 0.6  PROT 6.4* 5.8* 6.4* 5.9*  ALBUMIN 2.4* 2.2* 2.4* 2.2*   No results for input(s): LIPASE, AMYLASE in the  last 168 hours. No results for input(s): AMMONIA  in the last 168 hours. CBC: Recent Labs  Lab 02/17/21 1748 02/18/21 0230 02/19/21 0408 02/19/21 1930 02/20/21 0808 02/21/21 0043 02/22/21 0343 02/23/21 1019  WBC 5.6   < > 8.1  --  9.3 11.2* 10.3 8.7  NEUTROABS 2.8  --  4.2  --  5.3 6.1  --   --   HGB 6.5*   < > 6.8* 7.9* 8.2* 7.5* 8.3* 8.8*  HCT 21.6*   < > 21.8* 25.3* 26.5* 23.7* 26.4* 28.2*  MCV 88.9   < > 88.3  --  88.6 88.1 87.7 89.2  PLT 341   < > 333  --  381 347 400 415*   < > = values in this interval not displayed.   Cardiac Enzymes: No results for input(s): CKTOTAL, CKMB, CKMBINDEX, TROPONINI in the last 168 hours. BNP: Invalid input(s): POCBNP CBG: Recent Labs  Lab 02/22/21 1220 02/22/21 1744 02/23/21 0007 02/23/21 0612 02/23/21 1107  GLUCAP 118* 130* 94 101* 156*   D-Dimer No results for input(s): DDIMER in the last 72 hours. Hgb A1c No results for input(s): HGBA1C in the last 72 hours. Lipid Profile No results for input(s): CHOL, HDL, LDLCALC, TRIG, CHOLHDL, LDLDIRECT in the last 72 hours. Thyroid function studies No results for input(s): TSH, T4TOTAL, T3FREE, THYROIDAB in the last 72 hours.  Invalid input(s): FREET3 Anemia work up No results for input(s): VITAMINB12, FOLATE, FERRITIN, TIBC, IRON, RETICCTPCT in the last 72 hours. Urinalysis    Component Value Date/Time   COLORURINE RED (A) 02/17/2021 1748   APPEARANCEUR TURBID (A) 02/17/2021 1748   LABSPEC  02/17/2021 1748    TEST NOT REPORTED DUE TO COLOR INTERFERENCE OF URINE PIGMENT   PHURINE  02/17/2021 1748    TEST NOT REPORTED DUE TO COLOR INTERFERENCE OF URINE PIGMENT   GLUCOSEU (A) 02/17/2021 1748    TEST NOT REPORTED DUE TO COLOR INTERFERENCE OF URINE PIGMENT   HGBUR (A) 02/17/2021 1748    TEST NOT REPORTED DUE TO COLOR INTERFERENCE OF URINE PIGMENT   BILIRUBINUR (A) 02/17/2021 1748    TEST NOT REPORTED DUE TO COLOR INTERFERENCE OF URINE PIGMENT   KETONESUR (A) 02/17/2021 1748     TEST NOT REPORTED DUE TO COLOR INTERFERENCE OF URINE PIGMENT   PROTEINUR (A) 02/17/2021 1748    TEST NOT REPORTED DUE TO COLOR INTERFERENCE OF URINE PIGMENT   NITRITE (A) 02/17/2021 1748    TEST NOT REPORTED DUE TO COLOR INTERFERENCE OF URINE PIGMENT   LEUKOCYTESUR (A) 02/17/2021 1748    TEST NOT REPORTED DUE TO COLOR INTERFERENCE OF URINE PIGMENT   Sepsis Labs Invalid input(s): PROCALCITONIN,  WBC,  LACTICIDVEN Microbiology Recent Results (from the past 240 hour(s))  Urine Culture     Status: Abnormal   Collection Time: 02/17/21 12:36 AM   Specimen: Urine, Clean Catch  Result Value Ref Range Status   Specimen Description URINE, CLEAN CATCH  Final   Special Requests   Final    NONE Performed at [facility], [address], [place], [place] [address]    Culture (A)  Final    >=100,000 COLONIES/mL ESCHERICHIA COLI Confirmed Extended Spectrum Beta-Lactamase Producer (ESBL).  In bloodstream infections from ESBL organisms, carbapenems are preferred over piperacillin/tazobactam. They are shown to have a lower risk of mortality. >=100,000 COLONIES/mL ENTEROCOCCUS FAECIUM VANCOMYCIN RESISTANT ENTEROCOCCUS    Report Status 02/21/2021 FINAL  Final   Organism ID, Bacteria ESCHERICHIA COLI (A)  Final   Organism ID, Bacteria ENTEROCOCCUS FAECIUM (A)  Final  Susceptibility   Escherichia coli - MIC*    AMPICILLIN >=32 RESISTANT Resistant     CEFAZOLIN >=64 RESISTANT Resistant     CEFEPIME 4 INTERMEDIATE Intermediate     CEFTRIAXONE >=64 RESISTANT Resistant     CIPROFLOXACIN >=4 RESISTANT Resistant     GENTAMICIN >=16 RESISTANT Resistant     IMIPENEM <=0.25 SENSITIVE Sensitive     NITROFURANTOIN 64 INTERMEDIATE Intermediate     TRIMETH/SULFA <=20 SENSITIVE Sensitive     AMPICILLIN/SULBACTAM >=32 RESISTANT Resistant     PIP/TAZO <=4 SENSITIVE Sensitive     * >=100,000 COLONIES/mL ESCHERICHIA COLI   Enterococcus faecium - MIC*    AMPICILLIN >=32 RESISTANT Resistant      NITROFURANTOIN 64 INTERMEDIATE Intermediate     VANCOMYCIN >=32 RESISTANT Resistant     LINEZOLID 2 SENSITIVE Sensitive     * >=100,000 COLONIES/mL ENTEROCOCCUS FAECIUM  Resp Panel by RT-PCR (Flu A&B, Covid) Nasopharyngeal Swab     Status: None   Collection Time: 02/17/21 11:29 PM   Specimen: Nasopharyngeal Swab; Nasopharyngeal(NP) swabs in vial transport medium  Result Value Ref Range Status   SARS Coronavirus 2 by RT PCR NEGATIVE NEGATIVE Final    Comment: (NOTE) SARS-CoV-2 target nucleic acids are NOT DETECTED.  The SARS-CoV-2 RNA is generally detectable in upper respiratory specimens during the acute phase of infection. The lowest concentration of SARS-CoV-2 viral copies this assay can detect is 138 copies/mL. A negative result does not preclude SARS-Cov-2 infection and should not be used as the sole basis for treatment or other patient management decisions. A negative result may occur with  improper specimen collection/handling, submission of specimen other than nasopharyngeal swab, presence of viral mutation(s) within the areas targeted by this assay, and inadequate number of viral copies(<138 copies/mL). A negative result must be combined with clinical observations, patient history, and epidemiological information. The expected result is Negative.  Fact Sheet for Patients:  BloggerCourse.com  Fact Sheet for Healthcare Providers:  SeriousBroker.it  This test is no t yet approved or cleared by the [place] FDA and  has been authorized for detection and/or diagnosis of SARS-CoV-2 by FDA under an Emergency Use Authorization (EUA). This EUA will remain  in effect (meaning this test can be used) for the duration of the COVID-19 declaration under Section 564(b)(1) of the Act, 21 U.S.C.section 360bbb-3(b)(1), unless the authorization is terminated  or revoked sooner.       Influenza A by PCR NEGATIVE NEGATIVE Final    Influenza B by PCR NEGATIVE NEGATIVE Final    Comment: (NOTE) The Xpert Xpress SARS-CoV-2/FLU/RSV plus assay is intended as an aid in the diagnosis of influenza from Nasopharyngeal swab specimens and should not be used as a sole basis for treatment. Nasal washings and aspirates are unacceptable for Xpert Xpress SARS-CoV-2/FLU/RSV testing.  Fact Sheet for Patients: BloggerCourse.com  Fact Sheet for Healthcare Providers: SeriousBroker.it  This test is not yet approved or cleared by the [place] FDA and has been authorized for detection and/or diagnosis of SARS-CoV-2 by FDA under an Emergency Use Authorization (EUA). This EUA will remain in effect (meaning this test can be used) for the duration of the COVID-19 declaration under Section 564(b)(1) of the Act, 21 U.S.C. section 360bbb-3(b)(1), unless the authorization is terminated or revoked.  Performed at [facility], [address], [place], [place] [address]   Culture, blood (routine x 2)     Status: None   Collection Time: 02/18/21  2:30 AM   Specimen: BLOOD LEFT  ARM  Result Value Ref Range Status   Specimen Description BLOOD LEFT ARM  Final   Special Requests   Final    BOTTLES DRAWN AEROBIC AND ANAEROBIC Blood Culture results may not be optimal due to an excessive volume of blood received in culture bottles   Culture   Final    NO GROWTH 5 DAYS Performed at [facility], [address], [place], [place] [address]    Report Status 02/23/2021 FINAL  Final  Culture, blood (routine x 2)     Status: None   Collection Time: 02/18/21  2:44 AM   Specimen: BLOOD LEFT HAND  Result Value Ref Range Status   Specimen Description BLOOD LEFT HAND  Final   Special Requests   Final    BOTTLES DRAWN AEROBIC ONLY Blood Culture results may not be optimal due to an excessive volume of blood received in culture bottles   Culture   Final    NO GROWTH 5 DAYS Performed at [facility], [address] [place], [place] [address]    Report Status 02/23/2021 FINAL  Final     Time coordinating discharge: 35 minutes  SIGNED: Lanae Boast, MD  [facility] 02/23/2021, 11:16 AM  If 7PM-7AM, please contact night-coverage [email or web address]

## 2021-02-22 NOTE — [place] FL2 (Addendum)
[place] MEDICAID FL2 LEVEL OF CARE SCREENING TOOL     IDENTIFICATION  Patient Name: Matthew Williamson Birthdate: [birth year] Sex: male Admission Date (Current Location): 02/17/2021  [facility] and [place] Number:  Producer, television/film/video and Address:  [facility], [address], [place], [place] [address]      Provider Number: [id number]  Attending Physician Name and Address:  Lanae Boast, MD  Relative Name and Phone Number:  Albin Fischer (Daughter)   [phone] [facility])    Current Level of Care: Hospital Recommended Level of Care: Skilled Nursing Facility Prior Approval Number:    Date Approved/Denied:   PASRR Number:    Discharge Plan: SNF    Current Diagnoses: Patient Active Problem List   Diagnosis Date Noted   Complicated UTI (urinary tract infection) 02/18/2021   Acute metabolic encephalopathy 02/18/2021   AF (paroxysmal atrial fibrillation) (HCC) 02/18/2021   Type 2 diabetes mellitus without complication, without long-term current use of insulin (HCC) 02/18/2021   BPH with urinary obstruction 02/18/2021   Acute blood loss anemia 02/18/2021   Essential hypertension 02/18/2021   Gross hematuria 02/17/2021    Orientation RESPIRATION BLADDER Height & Weight     Self  Normal Incontinent, Indwelling catheter Weight: 280 lb (127 kg) Height:  5\' 11"  (180.3 cm)  BEHAVIORAL SYMPTOMS/MOOD NEUROLOGICAL BOWEL NUTRITION STATUS      Incontinent Diet (see d/c summary)  AMBULATORY STATUS COMMUNICATION OF NEEDS Skin   Extensive Assist Verbally Normal                       Personal Care Assistance Level of Assistance  Bathing, Feeding, Dressing Bathing Assistance: Maximum assistance Feeding assistance: Independent Dressing Assistance: Maximum assistance     Functional Limitations Info  Sight, Hearing, Speech Sight Info: Adequate Hearing Info: Adequate Speech Info: Adequate    SPECIAL CARE FACTORS FREQUENCY  PT (By licensed PT), OT (By  licensed OT)     PT Frequency: 5x/week OT Frequency: 5x/week            Contractures Contractures Info: Not present    Additional Factors Info  Code Status, Allergies Code Status Info: Full code Allergies Info: no known allergies           Current Medications (02/22/2021):  This is the current hospital active medication list Current Facility-Administered Medications  Medication Dose Route Frequency Provider Last Rate Last Admin   acetaminophen (TYLENOL) tablet 650 mg  650 mg Oral Q6H PRN 02/24/2021, MD   650 mg at 02/20/21 2026   Or   acetaminophen (TYLENOL) suppository 650 mg  650 mg Rectal Q6H PRN Shalhoub, 2027, MD       amLODipine (NORVASC) tablet 5 mg  5 mg Oral Daily Shalhoub, Deno Lunger, MD   5 mg at 02/21/21 02/23/21   apixaban (ELIQUIS) tablet 5 mg  5 mg Oral BID 9323., MD   5 mg at 02/21/21 2139   aspirin EC tablet 81 mg  81 mg Oral Daily 2140., MD   81 mg at 02/21/21 02/23/21   atorvastatin (LIPITOR) tablet 40 mg  40 mg Oral Daily 5573, MD   40 mg at 02/21/21 02/23/21   Chlorhexidine Gluconate Cloth 2 % PADS 6 each  6 each Topical Daily Kc, Ramesh, MD       finasteride (PROSCAR) tablet 5 mg  5 mg Oral Daily Shalhoub, 2202, MD   5 mg at  02/21/21 0918   fosfomycin (MONUROL) packet 3 g  3 g Oral Q48H Zigmund Daniel., MD   3 g at 02/21/21 0536   insulin aspart (novoLOG) injection 0-15 Units  0-15 Units Subcutaneous Q6H Shalhoub, Deno Lunger, MD       loratadine (CLARITIN) tablet 10 mg  10 mg Oral Daily Shalhoub, Deno Lunger, MD   10 mg at 02/21/21 0918   ondansetron (ZOFRAN) tablet 4 mg  4 mg Oral Q6H PRN Marinda Elk, MD       Or   ondansetron [facility]) injection 4 mg  4 mg Intravenous Q6H PRN Shalhoub, Deno Lunger, MD       pantoprazole (PROTONIX) EC tablet 40 mg  40 mg Oral Daily Shalhoub, Deno Lunger, MD   40 mg at 02/21/21 8546   phosphorus (K PHOS NEUTRAL) tablet 250 mg  250 mg Oral BID Kc, Dayna Barker, MD   250 mg at  02/21/21 2138   polyethylene glycol (MIRALAX / GLYCOLAX) packet 17 g  17 g Oral Daily PRN Marinda Elk, MD       tamsulosin (FLOMAX) capsule 0.4 mg  0.4 mg Oral QPC breakfast Shalhoub, Deno Lunger, MD   0.4 mg at 02/22/21 0827     Discharge Medications: Please see discharge summary for a list of discharge medications.  Relevant Imaging Results:  Relevant Lab Results:   Additional Information SSN [id number]  Patient will be going to [facility] for short term rehab at [address] [place] [address]. [phone]  Josee Speece Aris Lot, LCSW

## 2021-02-22 NOTE — Progress Notes (Addendum)
       RE:   Matthew Williamson      Date of Birth:  1946/06/15   Date:   02/22/2021    To Whom It May Concern:  Please be advised that the above-named patient will require a short-term nursing home stay - anticipated 30 days or less for rehabilitation and strengthening.  The plan is for return home.

## 2021-02-22 NOTE — Progress Notes (Signed)
PROGRESS NOTE    Matthew Williamson  DHR:[id number] DOB: Nov 16, 1945 DOA: 02/17/2021 PCP: Pcp, No   Chief Complaint  Patient presents with   Hematuria    Indwelling foley cath    Brief Narrative: 75 year old male with history of CVA, BPH with chronic urinary retention and chronic indwelling Foley, A. fib on Eliquis, type 2 diabetes, history of scrotal abscess with right orchiectomy presented with several days of hematuria.  He was recently hospitalized in June due to concern for pyelonephritis and cystitis due to ESBL E. coli with bacteremia and presentation complicated by AKI Due to concerns for Fournier's gangrene patient was eventually transferred to [facility] where patient was evaluated by urology and Fournier's gangrene was ruled out.  CT imaging of the abdomen and pelvis revealed a hypoattenuating lesion in the bladder and therefore patient underwent cystoscopy which revealed a clot that was evacuated.  Patient completed a course of meropenem followed by transitioning patient to oral Bactrim which was then eventually transition to oral Augmentin due to development of hyperkalemia..  Renal injury resolved and patient was discharged  Patient was admitted seen by urology here placed on continuous bladder irrigation and hematuria resolved.  Urine culture grew ESBL and Enterococcus and pharmacy recommended transition to plazomicin x3 doses.  Seen by PT OT and patient is a skilled nursing facility. Urology advised outpatient follow-up and keep the Foley catheter in.  Foley is draining well, ultrasound 8/16 shows  distended urinary bladder with mild bilateral hydronephrosis, enlarged prostate. On admission he had a markedly distended urinary bladder, moderate bilateral hydroureteronephrosis.  PT OT has assessed a skilled nursing facility upon discharge.  Subjective: Seen and examined this morning.  Resting comfortably on the bedside chair denies nausea vomiting.  Foley is draining and clear no more  bleeding.  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 At this time patient afebrile, hematuria resolved.  CBI discontinued.  Was on meropenem 8/13- 8/16 and transitioned to fosfomycin 8/16.  On presentation patient never had fever no leukocytosis. ??  If true infection versus colonization given his chronic indwelling Foley-but empirically treating with antibiotic given his previous  history.Since antibiotic change patient seems to be responding very well no dysuria, no hematuria no fever no leukocytosis.  I discussed with Dr. Earlene Plater from infectious disease who reviewed the chart-and recommends fosfomycin x 2 more doses and fosfomycin will have some activity against VRE   On admission he had marked distended urinary bladder and moderate bilateral hydronephrosis and ultrasound on repeat 8/16 shows distended urinary bladder and mild bilateral hydronephrosis. Discussed urology team repeat ultrasound ordered unchanged, again discussed Dr. Alvester Morin and urology will come by to evaluate the Foley today.  There is plan to have prostatectomy in next 2 months. Continue finasteride and tamsulosin.He had mild leukocytosis -and it is also resolved.  No urinary symptoms. Urine is clear. Foley is draining well 2600 ml UOP   Acute blood loss anemia due to #1 gross hematuria status post 2 unit PRBC.H&H  shows 7.5 g> 8.3 gm. Recent Labs  Lab 02/19/21 0408 02/19/21 1930 02/20/21 0808 02/21/21 0043 02/22/21 0343  HGB 6.8* 7.9* 8.2* 7.5* 8.3*  HCT 21.8* 25.3* 26.5* 23.7* 26.4*     Acute kidney injury: Resolved.Resume ACE inhibitor on discharge Recent Labs  Lab  02/18/21 1547 02/19/21 0408 02/20/21 0808 02/21/21 0043 02/22/21 0343  BUN 17 13  7* 12 9  CREATININE 1.14 0.95 0.79 0.93 1.05     A. fib on Eliquis: He is back on his anticoagulation.  Tolerating well  History of CVA Acute metabolic encephalopathy: In the setting of #1.  Mental status at baseline with baseline speech difficulties and chronic weakness on the right sided.   Cont his aspirin, Lipitor and Eliquis   Type 2 diabetes A1c 6 in June holding metformin continue SSI.  Resume metformin upon discharge blood sugar stable Recent Labs  Lab 02/21/21 1727 02/21/21 2352 02/22/21 0541 02/22/21 1133 02/22/21 1220  GLUCAP 106* 103* 120* 126* 118*   Essential hypertension: B P stable. Cont amlodipine  Class II obesity with BMI 39 will benefit with weight loss PCP follow-up.   Hypomagnesemia/hypophosphatemia: Replaced  Diet Order             Diet regular Room service appropriate? Yes; Fluid consistency: Thin  Diet effective now                  Patient's Body mass index is 39.05 kg/m.  DVT prophylaxis: SCDs Start: 02/18/21 0043 Eliquis Code Status:   Code Status: Full Code  Family Communication: plan of care discussed with patient at bedside. Status is: Inpatient  Remains inpatient appropriate because:Inpatient level of care appropriate due to severity of illness  Dispo: The patient is from: Home              Anticipated d/c is to: SNF ocne okay w/ urology              Patient currently is not medically stable to d/c.   Difficult to place patient No  Unresulted Labs (From admission, onward)    None      Medications reviewed:  Scheduled Meds:  amLODipine  5 mg Oral Daily   apixaban  5 mg Oral BID   aspirin EC  81 mg Oral Daily   atorvastatin  40 mg Oral Daily   Chlorhexidine Gluconate Cloth  6 each Topical Daily   finasteride  5 mg Oral Daily   fosfomycin  3 g Oral Q48H   insulin aspart  0-15 Units Subcutaneous Q6H   loratadine  10 mg Oral Daily   pantoprazole  40 mg Oral Daily   phosphorus  250 mg Oral BID   tamsulosin  0.4 mg Oral  QPC breakfast   Continuous Infusions:   Consultants:see note  Procedures:see note Antimicrobials: Anti-infectives (From admission, onward)    Start     Dose/Rate Route Frequency Ordered Stop   02/21/21 0600  fosfomycin (MONUROL) packet 3 g        3 g Oral Every 48 hours 02/20/21 1848 02/27/21 0559   02/18/21 1830  meropenem (MERREM) 1 g in sodium chloride 0.9 % 100 mL IVPB  Status:  Discontinued        1 g 200 mL/hr over 30 Minutes Intravenous Every 8 hours 02/18/21 1715 02/20/21 1846   02/18/21 0045  meropenem (MERREM) 1 g in sodium chloride 0.9 % 100 mL IVPB  Status:  Discontinued        1 g 200 mL/hr over 30 Minutes Intravenous Every 12 hours 02/18/21 0044 02/18/21 1715   02/17/21 2200  cefTRIAXone (ROCEPHIN) 1 g in sodium chloride 0.9 % 100 mL IVPB  Status:  Discontinued        1 g 200 mL/hr over 30 Minutes Intravenous Every  24 hours 02/17/21 2156 02/17/21 2332      Culture/Microbiology    Component Value Date/Time   SDES BLOOD LEFT HAND 02/18/2021 0244   SPECREQUEST  02/18/2021 0244    BOTTLES DRAWN AEROBIC ONLY Blood Culture results may not be optimal due to an excessive volume of blood received in culture bottles   CULT  02/18/2021 0244    NO GROWTH 4 DAYS Performed at [facility], [address] [place], [place] [id number]    REPTSTATUS PENDING 02/18/2021 0244   Other culture-see note Objective: Vitals: Today's Vitals   02/22/21 0117 02/22/21 0539 02/22/21 0827 02/22/21 1119  BP:  (!) 158/93 (!) 143/79   Pulse:  73 (!) 53   Resp:  18 15   Temp:  98 F (36.7 C) 98.3 F (36.8 C)   TempSrc:  Oral Oral   SpO2:  100% 100%   Weight:      Height:      PainSc: 0-No pain  0-No pain 0-No pain   Intake/Output Summary (Last 24 hours) at 02/22/2021 1600 Last data filed at 02/22/2021 1130 Gross per 24 hour  Intake 300 ml  Output 3600 ml  Net -3300 ml   Filed Weights   02/17/21 1736  Weight: 127 kg   Weight change:   Intake/Output from previous  day: 08/17 0701 - 08/18 0700 In: 650 [P.O.:600; IV Piggyback:50] Out: 2600 [Urine:2600] Intake/Output this shift: Total I/O In: 300 [P.O.:300] Out: 1000 [Urine:1000] Filed Weights   02/17/21 1736  Weight: 127 kg  Examination: General exam:AAOx x older than stated age, weak appearing. HEENT:Oral mucosa moist, Ear/Nose WNL grossly, dentition normal. Respiratory system: bilaterally diminished,no use of accessory muscle Cardiovascular system: S1 & S2 +, No JVD. Gastrointestinal system: Abdomen soft, NT,ND, BS+ Nervous System:Alert, awake, chronic rt sided weakness Extremities: no edema, distal peripheral pulses palpable.  Skin: No rashes,no icterus. MSK: Normal muscle bulk,tone, power.   Data Reviewed: I have personally reviewed following labs and imaging studies CBC: Recent Labs  Lab 02/17/21 1748 02/18/21 0230 02/18/21 1547 02/19/21 0408 02/19/21 1930 02/20/21 0808 02/21/21 0043 02/22/21 0343  WBC 5.6   < > 6.6 8.1  --  9.3 11.2* 10.3  NEUTROABS 2.8  --   --  4.2  --  5.3 6.1  --   HGB 6.5*   < > 7.6* 6.8* 7.9* 8.2* 7.5* 8.3*  HCT 21.6*   < > 24.2* 21.8* 25.3* 26.5* 23.7* 26.4*  MCV 88.9   < > 87.4 88.3  --  88.6 88.1 87.7  PLT 341   < > 347 333  --  381 347 400   < > = values in this interval not displayed.    Basic Metabolic Panel: Recent Labs  Lab 02/18/21 1547 02/19/21 0408 02/20/21 0808 02/21/21 0043 02/22/21 0343  NA 141 141 141 138 139  K 4.0 3.8 3.5 3.5 3.4*  CL 114* 113* 109 108 105  CO2 20* 23 26 23 23   GLUCOSE 92 108* 109* 115* 118*  BUN 17 13 7* 12 9  CREATININE 1.14 0.95 0.79 0.93 1.05  CALCIUM 8.8* 8.4* 8.5* 8.0* 8.9  MG 1.3* 1.6* 1.3* 1.6* 1.8  PHOS  --  2.8 2.8 2.4* 3.4    GFR: Estimated Creatinine Clearance: 83.8 mL/min (by C-G formula based on SCr of 1.05 mg/dL). Liver Function Tests: Recent Labs  Lab 02/18/21 1547 02/19/21 0408 02/20/21 0808 02/21/21 0043  AST 15 20 19 20   ALT 12 13 17  17  ALKPHOS 50 51 58 56  BILITOT 1.0 0.8  1.0 0.6  PROT 6.4* 5.8* 6.4* 5.9*  ALBUMIN 2.4* 2.2* 2.4* 2.2*    No results for input(s): LIPASE, AMYLASE in the last 168 hours. No results for input(s): AMMONIA in the last 168 hours. Coagulation Profile: No results for input(s): INR, PROTIME in the last 168 hours. Cardiac Enzymes: No results for input(s): CKTOTAL, CKMB, CKMBINDEX, TROPONINI in the last 168 hours. BNP (last 3 results) No results for input(s): PROBNP in the last 8760 hours. HbA1C: No results for input(s): HGBA1C in the last 72 hours. CBG: Recent Labs  Lab 02/21/21 1727 02/21/21 2352 02/22/21 0541 02/22/21 1133 02/22/21 1220  GLUCAP 106* 103* 120* 126* 118*    Lipid Profile: No results for input(s): CHOL, HDL, LDLCALC, TRIG, CHOLHDL, LDLDIRECT in the last 72 hours. Thyroid Function Tests: No results for input(s): TSH, T4TOTAL, FREET4, T3FREE, THYROIDAB in the last 72 hours. Anemia Panel: No results for input(s): VITAMINB12, FOLATE, FERRITIN, TIBC, IRON, RETICCTPCT in the last 72 hours. Sepsis Labs: Recent Labs  Lab 02/18/21 0230  LATICACIDVEN 1.6     Recent Results (from the past 240 hour(s))  Urine Culture     Status: Abnormal   Collection Time: 02/17/21 12:36 AM   Specimen: Urine, Clean Catch  Result Value Ref Range Status   Specimen Description URINE, CLEAN CATCH  Final   Special Requests   Final    NONE Performed at [facility], [address], [place], [place] [address]    Culture (A)  Final    >=100,000 COLONIES/mL ESCHERICHIA COLI Confirmed Extended Spectrum Beta-Lactamase Producer (ESBL).  In bloodstream infections from ESBL organisms, carbapenems are preferred over piperacillin/tazobactam. They are shown to have a lower risk of mortality. >=100,000 COLONIES/mL ENTEROCOCCUS FAECIUM VANCOMYCIN RESISTANT ENTEROCOCCUS    Report Status 02/21/2021 FINAL  Final   Organism ID, Bacteria ESCHERICHIA COLI (A)  Final   Organism ID, Bacteria ENTEROCOCCUS FAECIUM (A)  Final       Susceptibility   Escherichia coli - MIC*    AMPICILLIN >=32 RESISTANT Resistant     CEFAZOLIN >=64 RESISTANT Resistant     CEFEPIME 4 INTERMEDIATE Intermediate     CEFTRIAXONE >=64 RESISTANT Resistant     CIPROFLOXACIN >=4 RESISTANT Resistant     GENTAMICIN >=16 RESISTANT Resistant     IMIPENEM <=0.25 SENSITIVE Sensitive     NITROFURANTOIN 64 INTERMEDIATE Intermediate     TRIMETH/SULFA <=20 SENSITIVE Sensitive     AMPICILLIN/SULBACTAM >=32 RESISTANT Resistant     PIP/TAZO <=4 SENSITIVE Sensitive     * >=100,000 COLONIES/mL ESCHERICHIA COLI   Enterococcus faecium - MIC*    AMPICILLIN >=32 RESISTANT Resistant     NITROFURANTOIN 64 INTERMEDIATE Intermediate     VANCOMYCIN >=32 RESISTANT Resistant     LINEZOLID 2 SENSITIVE Sensitive     * >=100,000 COLONIES/mL ENTEROCOCCUS FAECIUM  Resp Panel by RT-PCR (Flu A&B, Covid) Nasopharyngeal Swab     Status: None   Collection Time: 02/17/21 11:29 PM   Specimen: Nasopharyngeal Swab; Nasopharyngeal(NP) swabs in vial transport medium  Result Value Ref Range Status   SARS Coronavirus 2 by RT PCR NEGATIVE NEGATIVE Final    Comment: (NOTE) SARS-CoV-2 target nucleic acids are NOT DETECTED.  The SARS-CoV-2 RNA is generally detectable in upper respiratory specimens during the acute phase of infection. The lowest concentration of SARS-CoV-2 viral copies this assay can detect is 138 copies/mL. A negative result does not preclude SARS-Cov-2 infection and should not be used  as the sole basis for treatment or other patient management decisions. A negative result may occur with  improper specimen collection/handling, submission of specimen other than nasopharyngeal swab, presence of viral mutation(s) within the areas targeted by this assay, and inadequate number of viral copies(<138 copies/mL). A negative result must be combined with clinical observations, patient history, and epidemiological information. The expected result is Negative.  Fact Sheet  for Patients:  BloggerCourse.com  Fact Sheet for Healthcare Providers:  SeriousBroker.it  This test is no t yet approved or cleared by the [place] FDA and  has been authorized for detection and/or diagnosis of SARS-CoV-2 by FDA under an Emergency Use Authorization (EUA). This EUA will remain  in effect (meaning this test can be used) for the duration of the COVID-19 declaration under Section 564(b)(1) of the Act, 21 U.S.C.section 360bbb-3(b)(1), unless the authorization is terminated  or revoked sooner.       Influenza A by PCR NEGATIVE NEGATIVE Final   Influenza B by PCR NEGATIVE NEGATIVE Final    Comment: (NOTE) The Xpert Xpress SARS-CoV-2/FLU/RSV plus assay is intended as an aid in the diagnosis of influenza from Nasopharyngeal swab specimens and should not be used as a sole basis for treatment. Nasal washings and aspirates are unacceptable for Xpert Xpress SARS-CoV-2/FLU/RSV testing.  Fact Sheet for Patients: BloggerCourse.com  Fact Sheet for Healthcare Providers: SeriousBroker.it  This test is not yet approved or cleared by the [place] FDA and has been authorized for detection and/or diagnosis of SARS-CoV-2 by FDA under an Emergency Use Authorization (EUA). This EUA will remain in effect (meaning this test can be used) for the duration of the COVID-19 declaration under Section 564(b)(1) of the Act, 21 U.S.C. section 360bbb-3(b)(1), unless the authorization is terminated or revoked.  Performed at [facility], [address], [place], [place] [address]   Culture, blood (routine x 2)     Status: None (Preliminary result)   Collection Time: 02/18/21  2:30 AM   Specimen: BLOOD LEFT ARM  Result Value Ref Range Status   Specimen Description BLOOD LEFT ARM  Final   Special Requests   Final    BOTTLES DRAWN AEROBIC AND ANAEROBIC Blood Culture results may not  be optimal due to an excessive volume of blood received in culture bottles   Culture   Final    NO GROWTH 4 DAYS Performed at [facility], [address], [place], [place] [address]    Report Status PENDING  Incomplete  Culture, blood (routine x 2)     Status: None (Preliminary result)   Collection Time: 02/18/21  2:44 AM   Specimen: BLOOD LEFT HAND  Result Value Ref Range Status   Specimen Description BLOOD LEFT HAND  Final   Special Requests   Final    BOTTLES DRAWN AEROBIC ONLY Blood Culture results may not be optimal due to an excessive volume of blood received in culture bottles   Culture   Final    NO GROWTH 4 DAYS Performed at [facility], [address], [place], [place] [address]    Report Status PENDING  Incomplete      Radiology Studies: US RENAL  Result Date: 02/22/2021 CLINICAL DATA:  Hydronephrosis. EXAM: RENAL / URINARY TRACT ULTRASOUND COMPLETE COMPARISON:  Ultrasound renal 02/20/2021 FINDINGS: Right Kidney: Renal measurements: 11.9 x 8.3 x 8.2 cm = volume: 421 mL. Echogenicity within normal limits. No mass. Persistent mild hydronephrosis visualized. Left Kidney: Renal measurements: 12 x 8.1 x  7.5 cm = volume: 380 mL. Echogenicity within normal limits. Redemonstration of a 1.5 x 1.6 x 1.5 cm simple cystic lesion. No associated mural nodularity, septation, vascularity. No solid mass. Persistent mild hydronephrosis visualized. Urinary bladder: Distended with urine appears normal for degree of bladder distention. Other: Cholelithiasis. Enlarged prostate measuring 6 x 5.3 x 5.2 cm which corresponds to a volume of 86 mL. IMPRESSION: 1. Persistent mild bilateral hydronephrosis as well as distended urinary bladder with urine. 2. Cholelithiasis. 3. Prostatomegaly. Electronically Signed   By: Tish Frederickson M.D.   On: 02/22/2021 15:31   US RENAL  Result Date: 02/20/2021 CLINICAL DATA:  Hydronephrosis EXAM: RENAL / URINARY TRACT ULTRASOUND COMPLETE COMPARISON:  CT  02/17/2021 FINDINGS: Right Kidney: Renal measurements: 11.6 x 6.7 x 6.5 cm = volume: 265. 8 mL. Echogenicity within normal limits. No mass. Mild right hydronephrosis. Left Kidney: Renal measurements: 11.6 x 6.8 x 5.7 cm = volume: 238.4 mL. Echogenicity within normal limits. Cyst at the midpole measuring 16 mm. Mild hydronephrosis. Bladder: Distended urinary bladder. Other: Mass at the base of the bladder presumably represents enlarged prostate. IMPRESSION: 1. Mild bilateral hydronephrosis with distended urinary bladder 2. Enlarged prostate Electronically Signed   By: Jasmine Pang M.D.   On: 02/20/2021 20:06     LOS: 5 days   Lanae Boast, MD [facility]  02/22/2021, 4:00 PM

## 2021-02-23 LAB — GLUCOSE, CAPILLARY
Glucose-Capillary: 101 mg/dL — ABNORMAL HIGH (ref 70–99)
Glucose-Capillary: 156 mg/dL — ABNORMAL HIGH (ref 70–99)
Glucose-Capillary: 94 mg/dL (ref 70–99)

## 2021-02-23 LAB — CULTURE, BLOOD (ROUTINE X 2)
Culture: NO GROWTH
Culture: NO GROWTH

## 2021-02-23 LAB — RESP PANEL BY RT-PCR (FLU A&B, COVID) ARPGX2
Influenza A by PCR: NEGATIVE
Influenza B by PCR: NEGATIVE
SARS Coronavirus 2 by RT PCR: NEGATIVE

## 2021-02-23 LAB — CBC
HCT: 28.2 % — ABNORMAL LOW (ref 39.0–52.0)
Hemoglobin: 8.8 g/dL — ABNORMAL LOW (ref 13.0–17.0)
MCH: 27.8 pg (ref 26.0–34.0)
MCHC: 31.2 g/dL (ref 30.0–36.0)
MCV: 89.2 fL (ref 80.0–100.0)
Platelets: 415 10*3/uL — ABNORMAL HIGH (ref 150–400)
RBC: 3.16 MIL/uL — ABNORMAL LOW (ref 4.22–5.81)
RDW: 16 % — ABNORMAL HIGH (ref 11.5–15.5)
WBC: 8.7 10*3/uL (ref 4.0–10.5)
nRBC: 0 % (ref 0.0–0.2)

## 2021-02-23 MED ORDER — FOSFOMYCIN TROMETHAMINE 3 G PO PACK: 3.0000 g | PACK | ORAL | 0 refills | Status: AC

## 2021-02-23 MED ORDER — FOSFOMYCIN TROMETHAMINE 3 G PO PACK: 3.0000 g | PACK | ORAL | Status: DC

## 2021-02-23 NOTE — Plan of Care (Signed)
  Problem: Health Behavior/Discharge Planning: Goal: Ability to manage health-related needs will improve Outcome: Progressing   

## 2021-02-23 NOTE — TOC Transition Note (Signed)
Transition of Care [facility]) - CM/SW Discharge Note   Patient Details  Name: Matthew Williamson MRN: [id number] Date of Birth: July 02, 1946  [facility] CM/SW Contact:  Carley Hammed, LCSWA Phone Number: 02/23/2021, 11:32 AM   Clinical Narrative:    Pt to be transported to [facility] via [facility]. Nurse to call report to [phone]. Pt will be in room 140.   Final next level of care: Skilled Nursing Facility Barriers to Discharge: Barriers Resolved   Patient Goals and CMS Choice        Discharge Placement              Patient chooses bed at:  ([facility]) Patient to be transferred to facility by: [facility] Name of family member notified: Alcario Drought Patient and family notified of of transfer: 02/23/21  Discharge Plan and Services     Post Acute Care Choice: Skilled Nursing Facility                               Social Determinants of Health (SDOH) Interventions     Readmission Risk Interventions No flowsheet data found.

## 2021-05-07 ENCOUNTER — Ambulatory Visit (INDEPENDENT_AMBULATORY_CARE_PROVIDER_SITE_OTHER): Payer: Medicare HMO | Admitting: Cardiology

## 2021-05-07 ENCOUNTER — Encounter: Payer: Self-pay | Admitting: *Deleted

## 2021-05-07 ENCOUNTER — Encounter (INDEPENDENT_AMBULATORY_CARE_PROVIDER_SITE_OTHER): Payer: Self-pay

## 2021-05-07 ENCOUNTER — Other Ambulatory Visit: Payer: Self-pay

## 2021-05-07 ENCOUNTER — Encounter: Payer: Self-pay | Admitting: Cardiology

## 2021-05-07 VITALS — BP 158/78 | HR 65 | Ht 71.0 in | Wt 211.0 lb

## 2021-05-07 DIAGNOSIS — I4892 Unspecified atrial flutter: Secondary | ICD-10-CM | POA: Insufficient documentation

## 2021-05-07 DIAGNOSIS — Z01810 Encounter for preprocedural cardiovascular examination: Secondary | ICD-10-CM | POA: Insufficient documentation

## 2021-05-07 DIAGNOSIS — E119 Type 2 diabetes mellitus without complications: Secondary | ICD-10-CM

## 2021-05-07 DIAGNOSIS — I480 Paroxysmal atrial fibrillation: Secondary | ICD-10-CM

## 2021-05-07 DIAGNOSIS — I484 Atypical atrial flutter: Secondary | ICD-10-CM

## 2021-05-07 DIAGNOSIS — Z7901 Long term (current) use of anticoagulants: Secondary | ICD-10-CM | POA: Diagnosis not present

## 2021-05-07 DIAGNOSIS — Z01818 Encounter for other preprocedural examination: Secondary | ICD-10-CM

## 2021-05-07 NOTE — Assessment & Plan Note (Signed)
Currently on Eliquis.  No bleeding.  No changes made.

## 2021-05-07 NOTE — Assessment & Plan Note (Signed)
4:1 conduction.  Heart rate well controlled.  No changes made.

## 2021-05-07 NOTE — Progress Notes (Signed)
Cardiology Office Note:    Date:  05/07/2021   ID:  Matthew Williamson, DOB 1945/12/21, MRN [id number]  PCP:  Darrol Jump, NP   [facility] Providers Cardiologist:  None     Referring MD: Darrol Jump, NP    History of Present Illness:    Matthew Williamson is a 75 y.o. male here for preoperative cardiac risk evaluation prior to urologic procedure at the request of Darrol Jump, NP.  Urologist is at [facility].  Upcoming prostatectomy on 06/04/2021.  Has atrial fibrillation, BPH, hypertension and diabetes prior history of stroke with chronic right-sided weakness and difficulty with speech.  Is on Lipitor.  Has been on Eliquis.  Has history of prior scrotal abscess with right orchiectomy.  Overall no chest pain, no shortness of breath no syncope.  No palpitations.  Atrial flutter noted on ECG.  Past Medical History:  Diagnosis Date   AF (paroxysmal atrial fibrillation) ([place]) 02/18/2021   BPH with urinary obstruction 02/18/2021   Essential hypertension 02/18/2021   Type 2 diabetes mellitus without complication, without long-term current use of insulin ([place]) 02/18/2021    Past Surgical History:  Procedure Laterality Date   TESTICLE REMOVAL      Current Medications: Current Meds  Medication Sig   Alogliptin Benzoate 25 MG TABS Take 1 tablet by mouth at bedtime.   apixaban (ELIQUIS) 5 MG TABS tablet Take 5 mg by mouth 2 (two) times daily.   aspirin EC 81 MG tablet Take 81 mg by mouth daily. Swallow whole.   atorvastatin (LIPITOR) 40 MG tablet Take 40 mg by mouth daily.   cyanocobalamin 1000 MCG tablet Take 1,000 mcg by mouth daily.   ferrous sulfate 325 (65 FE) MG tablet Take 325 mg by mouth in the morning and at bedtime.   finasteride (PROSCAR) 5 MG tablet Take 5 mg by mouth daily.   folic acid (FOLVITE) 1 MG tablet Take 1 mg by mouth daily.   lisinopril (ZESTRIL) 10 MG tablet Take 10 mg by mouth daily.   loratadine (CLARITIN) 10 MG tablet Take 10 mg by mouth  daily.   metFORMIN (GLUCOPHAGE) 1000 MG tablet Take 1,000 mg by mouth 2 (two) times daily with a meal.   pantoprazole (PROTONIX) 40 MG tablet Take 40 mg by mouth daily.   sitaGLIPtin (JANUVIA) 100 MG tablet Take 100 mg by mouth daily.   tamsulosin (FLOMAX) 0.4 MG CAPS capsule Take 0.4 mg by mouth.   traZODone (DESYREL) 50 MG tablet Take 50 mg by mouth at bedtime.   [DISCONTINUED] amLODipine (NORVASC) 5 MG tablet Take 5 mg by mouth daily.     Allergies:   Patient has no known allergies.   Social History   Socioeconomic History   Marital status: Single    Spouse name: Not on file   Number of children: Not on file   Years of education: Not on file   Highest education level: Not on file  Occupational History   Not on file  Tobacco Use   Smoking status: Never   Smokeless tobacco: Never  Vaping Use   Vaping Use: Never used  Substance and Sexual Activity   Alcohol use: Never   Drug use: Never   Sexual activity: Not on file  Other Topics Concern   Not on file  Social History Narrative   Not on file   Social Determinants of Health   Financial Resource Strain: Not on file  Food Insecurity: Not on file  Transportation Needs: Not on file  Physical Activity: Not on file  Stress: Not on file  Social Connections: Not on file     Family History: The patient's Family history is unknown by patient.  ROS:   Please see the history of present illness.    No fevers chills nausea vomiting syncope bleeding all other systems reviewed and are negative.  EKGs/Labs/Other Studies Reviewed:    The following studies were reviewed today: Prior office notes, lab work, prior EKG  EKG:  EKG is  ordered today.  The ekg ordered today demonstrates atrial flutter 65, 4:1 conduction.  Prior EKG from 02/18/2021 shows atrial fibrillation heart rate under good control.  Recent Labs: 02/21/2021: ALT 17 02/22/2021: BUN 9; Creatinine, Ser 1.05; Magnesium 1.8; Potassium 3.4; Sodium 139 02/23/2021:  Hemoglobin 8.8; Platelets 415  Recent Lipid Panel No results found for: CHOL, TRIG, HDL, CHOLHDL, VLDL, LDLCALC, LDLDIRECT   Risk Assessment/Calculations:         Physical Exam:    VS:  BP (!) 158/78   Pulse 65   Ht 5\' 11"  (1.803 m)   Wt 211 lb (95.7 kg)   SpO2 95%   BMI 29.43 kg/m     Wt Readings from Last 3 Encounters:  05/07/21 211 lb (95.7 kg)  02/17/21 280 lb (127 kg)     GEN:  Well nourished, well developed in no acute distress HEENT: Normal NECK: No JVD; No carotid bruits LYMPHATICS: No lymphadenopathy CARDIAC: Irreg, no murmurs, rubs, gallops RESPIRATORY:  Clear to auscultation without rales, wheezing or rhonchi  ABDOMEN: Soft, non-tender, non-distended MUSCULOSKELETAL:  mild LE edema; No deformity  SKIN: Warm and dry NEUROLOGIC:  Alert and oriented x 3, right weakness PSYCHIATRIC:  Normal affect   ASSESSMENT:    1. AF (paroxysmal atrial fibrillation) (HCC)   2. Pre-op evaluation   3. Preop cardiovascular exam   4. Atypical atrial flutter (HCC)   5. Chronic anticoagulation   6. Type 2 diabetes mellitus without complication, without long-term current use of insulin (HCC)    PLAN:    In order of problems listed above:  Preop cardiovascular exam We are going to check a pharmacologic stress test prior to urologic surgery.  He is unable to fully complete greater than 4 METS of activity.  Utilizes a walker.  Has cardiac risk factors including diabetes.  If stress test is overall reasonable, he may proceed with prostatectomy.  Atrial flutter (HCC) 4:1 conduction.  Heart rate well controlled.  No changes made.    Chronic anticoagulation Currently on Eliquis.  No bleeding.  No changes made.  Type 2 diabetes mellitus without complication, without long-term current use of insulin (HCC) Per primary team.  Excellently controlled with hemoglobin A1c of 6.1.     Shared Decision Making/Informed Consent The risks [chest pain, shortness of breath, cardiac  arrhythmias, dizziness, blood pressure fluctuations, myocardial infarction, stroke/transient ischemic attack, nausea, vomiting, allergic reaction, radiation exposure, metallic taste sensation and life-threatening complications (estimated to be 1 in 10,000)], benefits (risk stratification, diagnosing coronary artery disease, treatment guidance) and alternatives of a nuclear stress test were discussed in detail with Mr. Inglett and he agrees to proceed.    Medication Adjustments/Labs and Tests Ordered: Current medicines are reviewed at length with the patient today.  Concerns regarding medicines are outlined above.  Orders Placed This Encounter  Procedures   NM Myocar Multi W/Spect W/Wall Motion / EF   Cardiac Stress Test: Informed Consent Details: Physician/Practitioner Attestation; Transcribe to consent form and obtain patient signature   EKG 12-Lead  No orders of the defined types were placed in this encounter.   Patient Instructions  Medication Instructions:  The current medical regimen is effective;  continue present plan and medications.  *If you need a refill on your cardiac medications before your next appointment, please call your pharmacy*  Testing/Procedures: Your physician has requested that you have a lexiscan myoview. For further information please visit [email or web address]. Please follow instruction sheet, as given.  Follow-Up: At [facility], you and your health needs are our priority.  As part of our continuing mission to provide you with exceptional heart care, we have created designated Provider Care Teams.  These Care Teams include your primary Cardiologist (physician) and Advanced Practice Providers (APPs -  Physician Assistants and Nurse Practitioners) who all work together to provide you with the care you need, when you need it.  We recommend signing up for the patient portal called "MyChart".  Sign up information is provided on this After Visit Summary.  MyChart is  used to connect with patients for Virtual Visits (Telemedicine).  Patients are able to view lab/test results, encounter notes, upcoming appointments, etc.  Non-urgent messages can be sent to your provider as well.   To learn more about what you can do with MyChart, go to [email or web address].    Your next appointment:   Follow up as needed.  Thank you for choosing [facility]!!     Signed, Candee Furbish, MD  05/07/2021 3:30 PM    [place]

## 2021-05-07 NOTE — Patient Instructions (Signed)
Medication Instructions:  The current medical regimen is effective;  continue present plan and medications.  *If you need a refill on your cardiac medications before your next appointment, please call your pharmacy*  Testing/Procedures: Your physician has requested that you have a lexiscan myoview. For further information please visit [email or web address]. Please follow instruction sheet, as given.  Follow-Up: At [facility], you and your health needs are our priority.  As part of our continuing mission to provide you with exceptional heart care, we have created designated Provider Care Teams.  These Care Teams include your primary Cardiologist (physician) and Advanced Practice Providers (APPs -  Physician Assistants and Nurse Practitioners) who all work together to provide you with the care you need, when you need it.  We recommend signing up for the patient portal called "MyChart".  Sign up information is provided on this After Visit Summary.  MyChart is used to connect with patients for Virtual Visits (Telemedicine).  Patients are able to view lab/test results, encounter notes, upcoming appointments, etc.  Non-urgent messages can be sent to your provider as well.   To learn more about what you can do with MyChart, go to [email or web address].    Your next appointment:   Follow up as needed.  Thank you for choosing [facility]!!      

## 2021-05-07 NOTE — Assessment & Plan Note (Signed)
Per primary team.  Excellently controlled with hemoglobin A1c of 6.1.

## 2021-05-07 NOTE — Assessment & Plan Note (Addendum)
We are going to check a pharmacologic stress test prior to urologic surgery.  He is unable to fully complete greater than 4 METS of activity.  Utilizes a walker.  Has cardiac risk factors including diabetes.  If stress test is overall reasonable, he may proceed with prostatectomy.

## 2021-05-15 ENCOUNTER — Ambulatory Visit (HOSPITAL_COMMUNITY)
Admission: RE | Admit: 2021-05-15 | Discharge: 2021-05-15 | Disposition: A | Discharge status: Home / Self Care | Payer: Medicare HMO | Source: Ambulatory Visit | Attending: Cardiology | Admitting: Cardiology

## 2021-05-15 ENCOUNTER — Other Ambulatory Visit: Payer: Self-pay

## 2021-05-15 DIAGNOSIS — Z01810 Encounter for preprocedural cardiovascular examination: Secondary | ICD-10-CM

## 2021-05-15 DIAGNOSIS — Z01818 Encounter for other preprocedural examination: Secondary | ICD-10-CM | POA: Diagnosis present

## 2021-05-15 DIAGNOSIS — I480 Paroxysmal atrial fibrillation: Secondary | ICD-10-CM | POA: Diagnosis present

## 2021-05-15 LAB — NM MYOCAR MULTI W/SPECT W/WALL MOTION / EF
LV dias vol: 115 mL (ref 62–150)
LV sys vol: 37 mL
Nuc Stress EF: 68 %
Peak HR: 70 {beats}/min
RATE: 0.3
Rest HR: 47 {beats}/min
Rest Nuclear Isotope Dose: 8.3 mCi
SDS: 1
SRS: 1
SSS: 2
ST Depression (mm): 0 mm
Stress Nuclear Isotope Dose: 27 mCi
TID: 1.04

## 2021-05-15 MED ORDER — REGADENOSON 0.4 MG/5ML IV SOLN
INTRAVENOUS | Status: AC
  Administered 2021-05-15: 0.4 mg via INTRAVENOUS
  Filled 2021-05-15: qty 5

## 2021-05-15 MED ORDER — TECHNETIUM TC 99M TETROFOSMIN IV KIT
30.0000 | PACK | Freq: Once | INTRAVENOUS | Status: AC | PRN
  Administered 2021-05-15: 27 via INTRAVENOUS

## 2021-05-15 MED ORDER — SODIUM CHLORIDE FLUSH 0.9 % IV SOLN
INTRAVENOUS | Status: AC
  Administered 2021-05-15: 10 mL via INTRAVENOUS
  Filled 2021-05-15: qty 10

## 2021-05-15 MED ORDER — TECHNETIUM TC 99M TETROFOSMIN IV KIT
10.0000 | PACK | Freq: Once | INTRAVENOUS | Status: AC | PRN
  Administered 2021-05-15: 8.3 via INTRAVENOUS

## 2021-10-06 DEATH — deceased

## 2022-09-30 IMAGING — CT CT RENAL STONE PROTOCOL
2 of 4 series · 15 of 46 positions shown, 17 images · non-contrast
Comparison: Abdominopelvic CT 01/04/2021

CLINICAL DATA: Hematuria.  Renal failure.

EXAM:
CT ABDOMEN AND PELVIS WITHOUT CONTRAST
TECHNIQUE: Multidetector CT imaging of the abdomen and pelvis was performed
following the standard protocol without IV contrast.

[Series 3: stone study 5.0 i30f 2 · axial · 0.98mm/px · z∈[+701,+1101]mm · 12 of 92 slices shown, 14 images]
[im 8/92  soft-tissue]
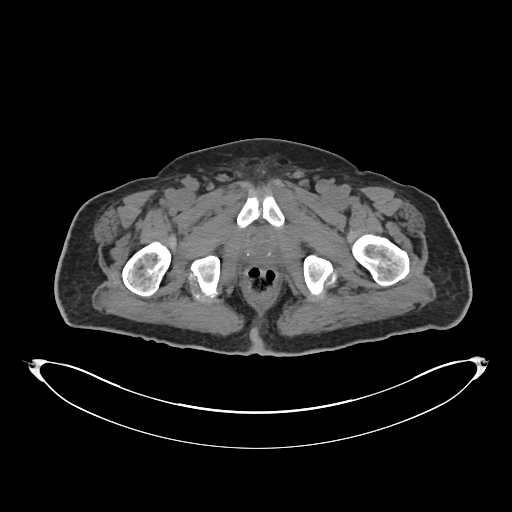
[im 8/92  bone]
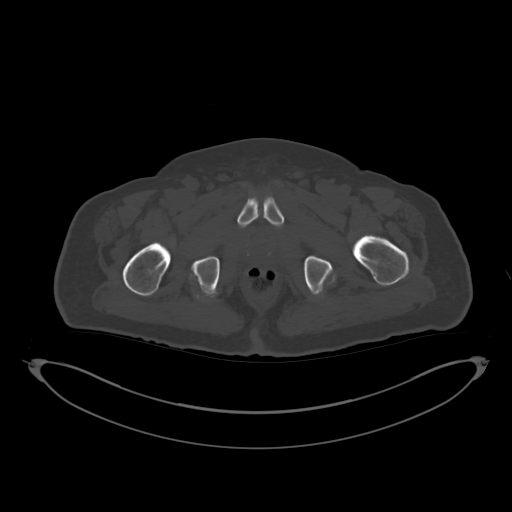
[im 15/92  soft-tissue]
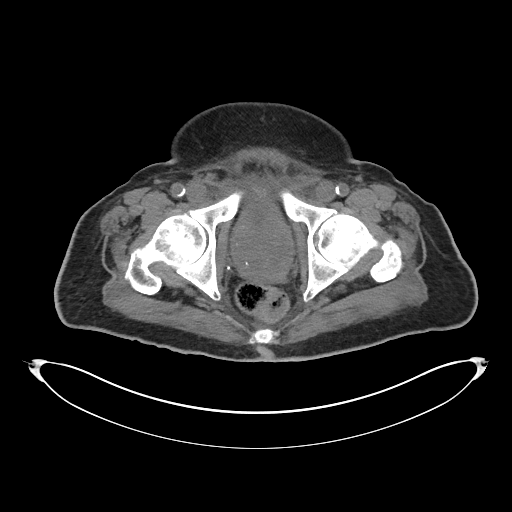
[im 22/92  soft-tissue]
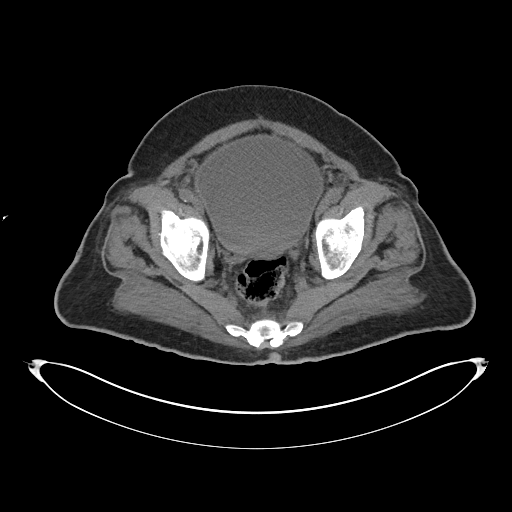
[im 30/92  soft-tissue]
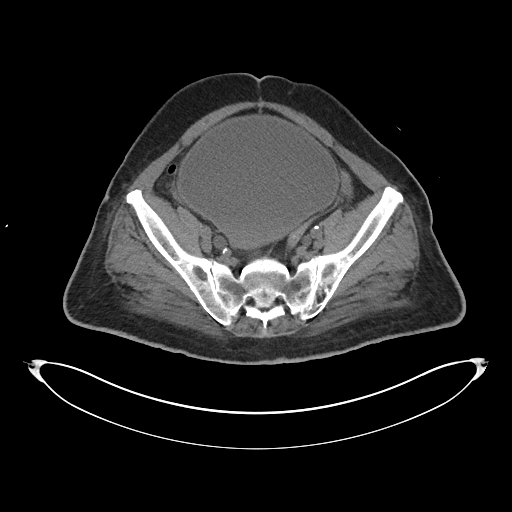
[im 37/92  soft-tissue]
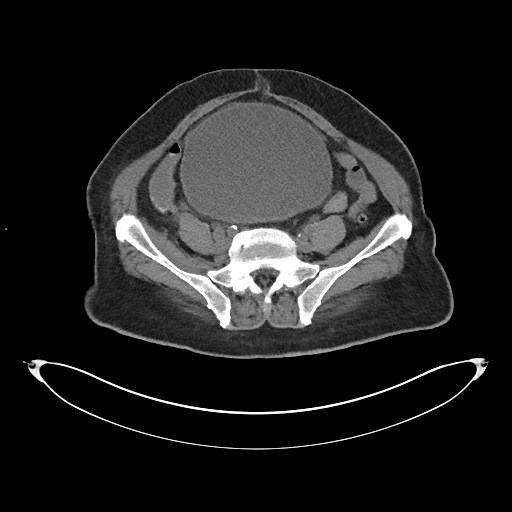
[im 44/92  soft-tissue]
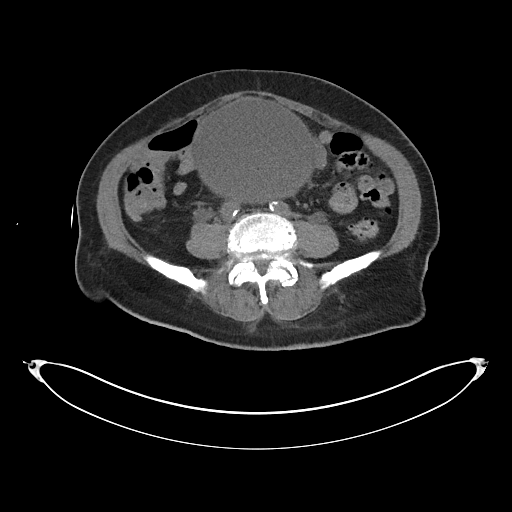
[im 51/92  soft-tissue]
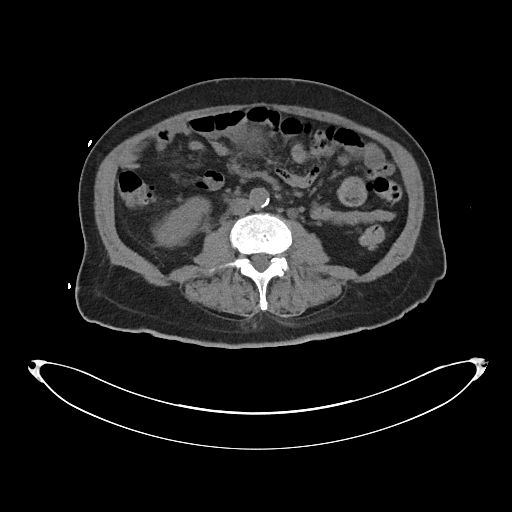
[im 59/92  soft-tissue]
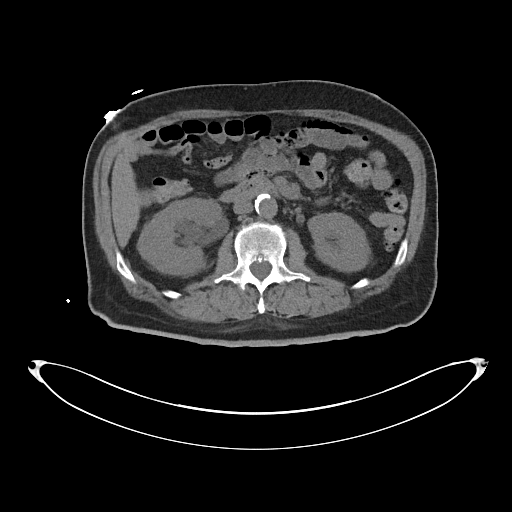
[im 66/92  soft-tissue]
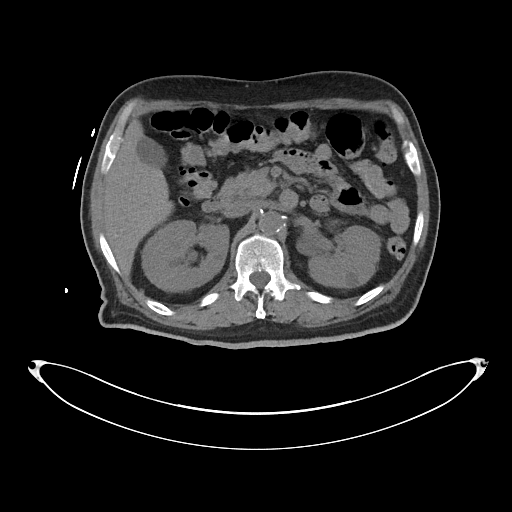
[im 66/92  bone]
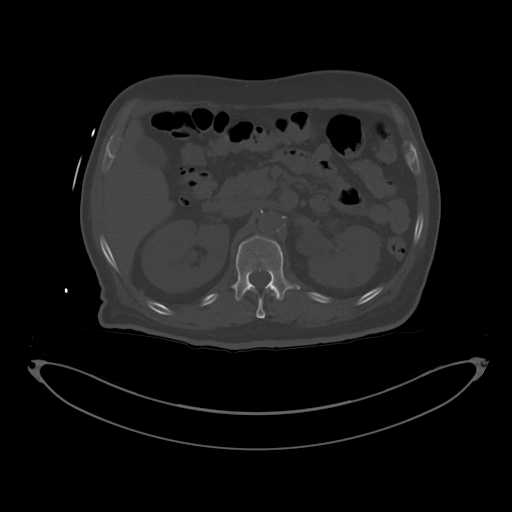
[im 73/92  soft-tissue]
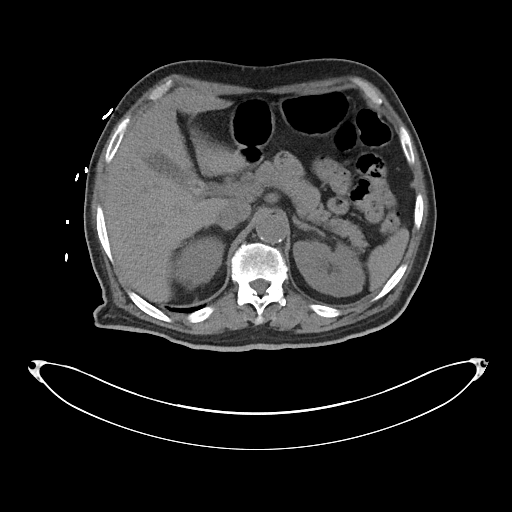
[im 81/92  soft-tissue]
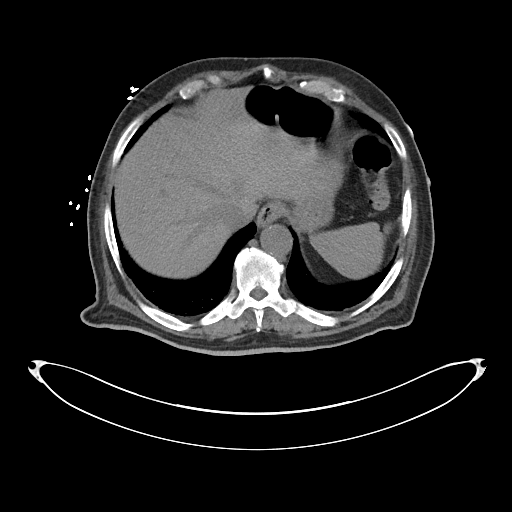
[im 88/92  soft-tissue]
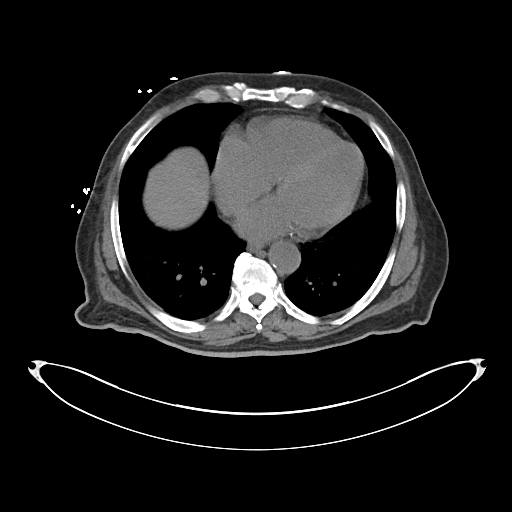

[Series 6: coronal soft tissue · coronal · 0.78mm/px · 3 of 98 slices shown]
[im 33/98  soft-tissue]
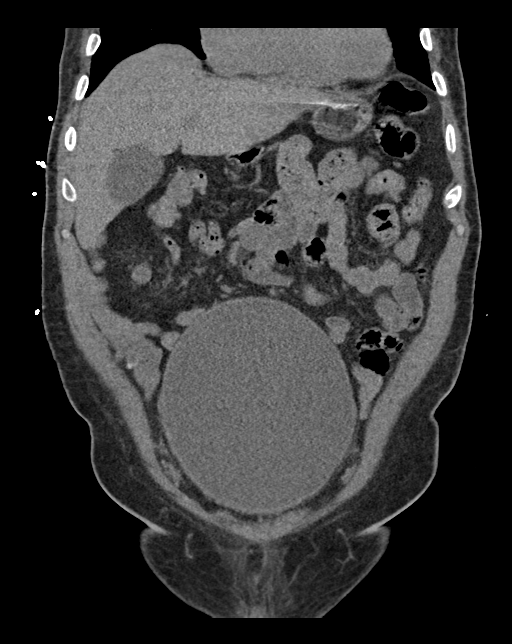
[im 44/98  soft-tissue]
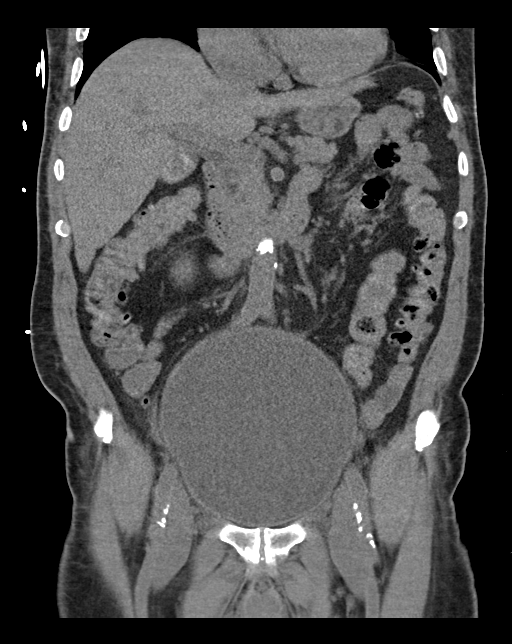
[im 54/98  soft-tissue]
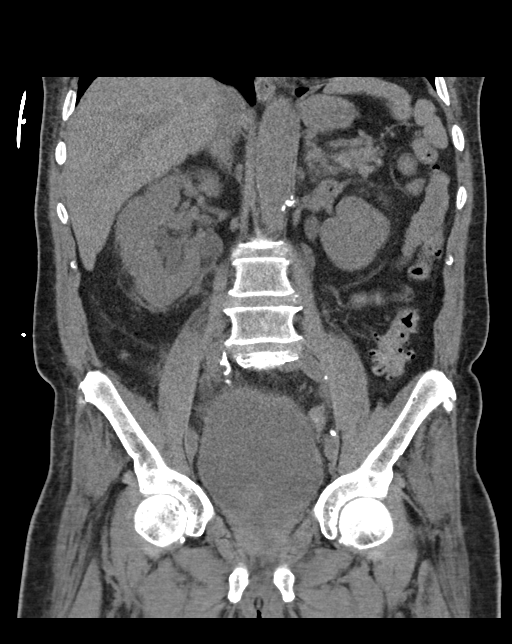

[15 of 46 positions shown; findings below may reference images not displayed]

FINDINGS: Lower chest: Mild cardiomegaly. Trace left pleural thickening
without significant effusion. No focal airspace disease.

Hepatobiliary: Borderline hepatic steatosis. No evidence of focal
hepatic lesion on this unenhanced exam. Lamellated gallstone
measuring 3.2 cm. No pericholecystic fat stranding. No biliary
dilatation.

Pancreas: No ductal dilatation or inflammation.

Spleen: Normal in size without focal abnormality.

Adrenals/Urinary Tract: No adrenal nodule. There is moderate
bilateral hydroureteronephrosis. Both ureters are dilated to the
bladder insertion. No renal or ureteral calculi bladder is again
noted to be markedly distended extending above the umbilicus with
bladder volume = 0699 cm^3. Previous air in the bladder wall is no
longer seen. There is no definitive bladder wall thickening. Nodular
mass effect on the bladder base from enlarged prostate gland again
seen. 15 mm cyst in the posterior left kidney. Smaller low-density
in the upper left kidney is too small to characterize but likely
cyst. Tiny low-density in the medial right kidney is also too small
to characterize. These lesions are not well assessed on prior exam
due to motion.

Stomach/Bowel: Partially distended stomach. There is no small bowel
obstruction or inflammation. Normal air-filled appendix. Diffuse
colonic diverticulosis. No diverticulitis. No colonic inflammation.

Vascular/Lymphatic: Moderate aortic and iliac atherosclerosis. No
aortic aneurysm. Scattered small retroperitoneal lymph nodes, not
enlarged by size criteria. No pelvic adenopathy.

Reproductive: Enlarged prostate gland spans 6.5 x 6.4 x 5.8 cm
(volume = 130 cm^3) and causes mass effect on the bladder base.

Other: Trace perivesicular edema without significant free fluid. No
free air. Tiny fat containing umbilical hernia. Minimal fat in the
right inguinal canal.

Musculoskeletal: Grade 1 anterolisthesis of L4 on L5 likely due to
prominent facet degeneration. Multilevel degenerative change
throughout the remainder of the lumbar spine. Degenerative change of
both hips. Peripherally sclerotic lesion in the left iliac bone
appears benign. There are no acute or suspicious osseous
abnormalities.
IMPRESSION: 1. Markedly distended urinary bladder. Moderate bilateral
hydroureteronephrosis to the bladder insertion. No renal or ureteral
calculi. Findings are likely secondary to bladder outlet obstruction
given enlarged prostate gland causing mass effect on the bladder
base.
2. Colonic diverticulosis without diverticulitis.
3. Cholelithiasis without gallbladder inflammation.

Aortic Atherosclerosis ([id number]).
